# Patient Record
Sex: Female | Born: 1935 | Race: White | Hispanic: No | Marital: Single | Smoking: Never smoker
Health system: Southern US, Community
[De-identification: ages and names within clinical notes are randomized; demographics above are authoritative.]

## PROBLEM LIST (undated history)

## (undated) DIAGNOSIS — H3530 Unspecified macular degeneration: Secondary | ICD-10-CM

## (undated) DIAGNOSIS — F0390 Unspecified dementia without behavioral disturbance: Secondary | ICD-10-CM

## (undated) DIAGNOSIS — I10 Essential (primary) hypertension: Secondary | ICD-10-CM

## (undated) DIAGNOSIS — H409 Unspecified glaucoma: Secondary | ICD-10-CM

## (undated) DIAGNOSIS — E785 Hyperlipidemia, unspecified: Secondary | ICD-10-CM

## (undated) DIAGNOSIS — H547 Unspecified visual loss: Secondary | ICD-10-CM

## (undated) HISTORY — DX: Hyperlipidemia, unspecified: E78.5

## (undated) HISTORY — DX: Essential (primary) hypertension: I10

---

## 2018-01-12 DIAGNOSIS — Y30XXXA Falling, jumping or pushed from a high place, undetermined intent, initial encounter: Secondary | ICD-10-CM | POA: Diagnosis not present

## 2018-01-12 DIAGNOSIS — I10 Essential (primary) hypertension: Secondary | ICD-10-CM | POA: Diagnosis not present

## 2018-01-12 DIAGNOSIS — S7002XA Contusion of left hip, initial encounter: Secondary | ICD-10-CM | POA: Diagnosis not present

## 2018-01-12 DIAGNOSIS — S0081XA Abrasion of other part of head, initial encounter: Secondary | ICD-10-CM | POA: Diagnosis not present

## 2018-01-12 DIAGNOSIS — S40022A Contusion of left upper arm, initial encounter: Secondary | ICD-10-CM | POA: Diagnosis not present

## 2018-01-12 DIAGNOSIS — Z760 Encounter for issue of repeat prescription: Secondary | ICD-10-CM | POA: Diagnosis not present

## 2018-01-12 DIAGNOSIS — S0083XA Contusion of other part of head, initial encounter: Secondary | ICD-10-CM | POA: Diagnosis not present

## 2018-01-20 ENCOUNTER — Ambulatory Visit (INDEPENDENT_AMBULATORY_CARE_PROVIDER_SITE_OTHER): Payer: Medicare HMO | Admitting: Family Medicine

## 2018-01-20 ENCOUNTER — Encounter: Payer: Self-pay | Admitting: Family Medicine

## 2018-01-20 VITALS — BP 148/73 | HR 64 | Temp 97.5°F | Resp 16 | Ht 62.0 in | Wt 127.8 lb

## 2018-01-20 DIAGNOSIS — R413 Other amnesia: Secondary | ICD-10-CM

## 2018-01-20 DIAGNOSIS — Z9181 History of falling: Secondary | ICD-10-CM | POA: Diagnosis not present

## 2018-01-20 DIAGNOSIS — I10 Essential (primary) hypertension: Secondary | ICD-10-CM | POA: Diagnosis not present

## 2018-01-20 MED ORDER — LISINOPRIL 20 MG PO TABS: ORAL_TABLET | ORAL | 1 refills | Status: DC

## 2018-01-20 NOTE — Patient Instructions (Addendum)
No change in blood pressure meds for now. Continue lisinopril once per day.   I will refer you to neurology to discuss memory concerns.   I would also like you to meet with physical therapy. If you are having any new unsteadiness - be seen right away or in emergency room. I would recommend using a cane to help with stability.   Follow up in 1 month to review prior records.   Return to the clinic or go to the nearest emergency room if any of your symptoms worsen or new symptoms occur.   Fall Prevention in the Home Falls can cause injuries and can affect people from all age groups. There are many simple things that you can do to make your home safe and to help prevent falls. What can I do on the outside of my home?  Regularly repair the edges of walkways and driveways and fix any cracks.  Remove high doorway thresholds.  Trim any shrubbery on the main path into your home.  Use bright outdoor lighting.  Clear walkways of debris and clutter, including tools and rocks.  Regularly check that handrails are securely fastened and in good repair. Both sides of any steps should have handrails.  Install guardrails along the edges of any raised decks or porches.  Have leaves, snow, and ice cleared regularly.  Use sand or salt on walkways during winter months.  In the garage, clean up any spills right away, including grease or oil spills. What can I do in the bathroom?  Use night lights.  Install grab bars by the toilet and in the tub and shower. Do not use towel bars as grab bars.  Use non-skid mats or decals on the floor of the tub or shower.  If you need to sit down while you are in the shower, use a plastic, non-slip stool.  Keep the floor dry. Immediately clean up any water that spills on the floor.  Remove soap buildup in the tub or shower on a regular basis.  Attach bath mats securely with double-sided non-slip rug tape.  Remove throw rugs and other tripping hazards from  the floor. What can I do in the bedroom?  Use night lights.  Make sure that a bedside light is easy to reach.  Do not use oversized bedding that drapes onto the floor.  Have a firm chair that has side arms to use for getting dressed.  Remove throw rugs and other tripping hazards from the floor. What can I do in the kitchen?  Clean up any spills right away.  Avoid walking on wet floors.  Place frequently used items in easy-to-reach places.  If you need to reach for something above you, use a sturdy step stool that has a grab bar.  Keep electrical cables out of the way.  Do not use floor polish or wax that makes floors slippery. If you have to use wax, make sure that it is non-skid floor wax.  Remove throw rugs and other tripping hazards from the floor. What can I do in the stairways?  Do not leave any items on the stairs.  Make sure that there are handrails on both sides of the stairs. Fix handrails that are broken or loose. Make sure that handrails are as long as the stairways.  Check any carpeting to make sure that it is firmly attached to the stairs. Fix any carpet that is loose or worn.  Avoid having throw rugs at the top or bottom of  stairways, or secure the rugs with carpet tape to prevent them from moving.  Make sure that you have a light switch at the top of the stairs and the bottom of the stairs. If you do not have them, have them installed. What are some other fall prevention tips?  Wear closed-toe shoes that fit well and support your feet. Wear shoes that have rubber soles or low heels.  When you use a stepladder, make sure that it is completely opened and that the sides are firmly locked. Have someone hold the ladder while you are using it. Do not climb a closed stepladder.  Add color or contrast paint or tape to grab bars and handrails in your home. Place contrasting color strips on the first and last steps.  Use mobility aids as needed, such as canes,  walkers, scooters, and crutches.  Turn on lights if it is dark. Replace any light bulbs that burn out.  Set up furniture so that there are clear paths. Keep the furniture in the same spot.  Fix any uneven floor surfaces.  Choose a carpet design that does not hide the edge of steps of a stairway.  Be aware of any and all pets.  Review your medicines with your healthcare provider. Some medicines can cause dizziness or changes in blood pressure, which increase your risk of falling. Talk with your health care provider about other ways that you can decrease your risk of falls. This may include working with a physical therapist or trainer to improve your strength, balance, and endurance. This information is not intended to replace advice given to you by your health care provider. Make sure you discuss any questions you have with your health care provider. Document Released: 03/18/2002 Document Revised: 08/25/2015 Document Reviewed: 05/02/2014 Elsevier Interactive Patient Education  [employer].   If you have lab work done today you will be contacted with your lab results within the next 2 weeks.  If you have not heard from [place] then please contact us. The fastest way to get your results is to register for My Chart.   IF you received an x-ray today, you will receive an invoice from [facility]. Please contact [facility] at [phone] with questions or concerns regarding your invoice.   IF you received labwork today, you will receive an invoice from [place]. Please contact [facility] at [phone] with questions or concerns regarding your invoice.   Our billing staff will not be able to assist you with questions regarding bills from these companies.  You will be contacted with the lab results as soon as they are available. The fastest way to get your results is to activate your My Chart account. Instructions are located on the last page of this paperwork. If you have  not heard from [place] regarding the results in 2 weeks, please contact this office.

## 2018-01-20 NOTE — Progress Notes (Signed)
Subjective:    Patient ID: Sheri Miller, female    DOB: 1924/02/16, 84 y.o.   MRN: [id number]  HPI Sheri Miller is a 84 y.o. female Presents today for: Chief Complaint  Patient presents with  . Establish Care    patient is establishing care; high fall risk, history of memory loss/confusion and weakness  . blood pressure    currently on blood pressure medication   New patient to me, here to potentially establish care.  She has a history of hypertension, hyperlipidemia per problem list.  Currently on lisinopril 20 mg daily for hypertension.  No previous records noted in [facility] or care everywhere.  Here with son who is providing history. Moved from [place] 3 months ago. [facility], previous provider Sheri Miller. Living with son.  There was some concern with her forgetting things, and concern with living at home. Son feels like memory has been an issue for years, but seems to be worse past few months, but no acute changes in past week or so.   Seen at [facility] urgent Care 11/07/27 and 01/12/18 after falls - initially tripped on root in the bog garden, and then fell on a back deck after going through screen door. No LOC, no hx of CVA.   Records reviewed: 7/29: BP 207/72, facial bone xrays - no acute facial bone fx. R hand XR - degenerative changes, osteopenia, no fracture. Lisinopril 20mg  QD prescribed.   01/12/18: Had run out of lisinopril. Not on meds. BP 151/103 Diagnosed with contusions, abrasions.td negative, L hip XR negative, facial bone XR negative.   Restarted lisinopril 20mg  QD.   Son out of work due to taking her to Urgent care on 10/4, plans on FMLA paperwork for that visit and future visit. Son is POA.   Bruising on face and arm ok. Has had some left shoulder pain from years prior. No recent new pains.    There are no active problems to display for this patient.  Past Medical History:  Diagnosis Date  . Hyperlipidemia   . Hypertension     No Known Allergies Prior to  Admission medications   Medication Sig Start Date End Date Taking? Authorizing Provider  lisinopril (PRINIVIL,ZESTRIL) 20 MG tablet TK 1 T PO QD 01/12/18  Yes [provider]   Social History   Socioeconomic History  . Marital status: Single    Spouse name: Not on file  . Number of children: Not on file  . Years of education: Not on file  . Highest education level: Not on file  Occupational History  . Not on file  Social Needs  . Financial resource strain: Not on file  . Food insecurity:    Worry: Not on file    Inability: Not on file  . Transportation needs:    Medical: Not on file    Non-medical: Not on file  Tobacco Use  . Smoking status: Never Smoker  . Smokeless tobacco: Never Used  Substance and Sexual Activity  . Alcohol use: Not on file  . Drug use: Not on file  . Sexual activity: Not on file  Lifestyle  . Physical activity:    Days per week: Not on file    Minutes per session: Not on file  . Stress: Not on file  Relationships  . Social connections:    Talks on phone: Not on file    Gets together: Not on file    Attends religious service: Not on file  Active member of club or organization: Not on file    Attends meetings of clubs or organizations: Not on file    Relationship status: Not on file  . Intimate partner violence:    Fear of current or ex partner: Not on file    Emotionally abused: Not on file    Physically abused: Not on file    Forced sexual activity: Not on file  Other Topics Concern  . Not on file  Social History Narrative  . Not on file    Review of Systems Per HPI     Objective:   Physical Exam  Constitutional: She appears well-developed and well-nourished.  HENT:  Head: Normocephalic and atraumatic.  Eyes: Pupils are equal, round, and reactive to light. Conjunctivae and EOM are normal.  Neck: Carotid bruit is not present.  Cardiovascular: Normal rate, regular rhythm, normal heart sounds and intact distal pulses.    Pulmonary/Chest: Effort normal and breath sounds normal.  Abdominal: Soft. She exhibits no pulsatile midline mass. There is no tenderness.  Musculoskeletal:  Appears to have full range of motion without apparent difficulty in the upper extremities bilaterally, walking without apparent discomfort.  Neurological: She is alert.  Ambulates without assistance, initially when she stood up and turned the corner seemed to lean slightly but then self corrects, ambulates down the hall with overall normal-appearing gait, fair rate of speed.  Skin: Skin is warm and dry.  Healing ecchymosis noted on left face, left upper arm.  Psychiatric: She has a normal mood and affect. Her behavior is normal.  Vitals reviewed.  Vitals:   01/20/18 1050  BP: (!) 148/73  Pulse: 64  Resp: 16  Temp: (!) 97.5 F (36.4 C)  TempSrc: Oral  SpO2: 96%  Weight: 127 lb 12.8 oz (58 kg)  Height: 5\' 2"  (1.575 m)       Assessment & Plan:   Sheri Miller is a 84 y.o. female  Here to establish care.  Plan discussed with patient and son who is her power of attorney.  Understanding expressed.  History of falling - Plan: Ambulatory referral to Physical Therapy, Ambulatory referral to Neurology  -Both appear to be mechanical falls, denies near syncope/syncope symptoms.  Nonfocal neurologic exam, and gait testing overall reassuring.  Bruising appears to be improving.  Denies any new weakness or range of motion difficulties.  - recommend use of assistive device such as cane if any unsteadiness or in area where there may be trip hazards.    - handout given on fall prevention at home and will refer to physical therapy to assess strength, potentially occupational therapy may be beneficial as well, but will have her evaluated by neurology to determine other needs testing/treatment.  Essential hypertension - Plan: Basic metabolic panel, lisinopril (PRINIVIL,ZESTRIL) 20 MG tablet  -Borderline, but at current age we will continue same  dose of lisinopril for now, recheck next visit  Memory difficulties - Plan: Ambulatory referral to Neurology  -Suspect component of dementia, denies acute changes so delirium less likely.  Now living with son which is a change in her usual routine which may also contribute to some confusion.  Signs or symptoms of delirium were discussed with son and need for acute eval if those were present.  For now will refer to neurology for memory testing/decisions on treatment.   Recheck 1 month to review previous records and further establish care.  RTC precautions if any new or acute worsening symptoms sooner.   Meds ordered this encounter  Medications  . lisinopril (PRINIVIL,ZESTRIL) 20 MG tablet    Sig: TK 1 T PO QD    Dispense:  90 tablet    Refill:  1   Patient Instructions   No change in blood pressure meds for now. Continue lisinopril once per day.   I will refer you to neurology to discuss memory concerns.   I would also like you to meet with physical therapy. If you are having any new unsteadiness - be seen right away or in emergency room. I would recommend using a cane to help with stability.   Follow up in 1 month to review prior records.   Return to the clinic or go to the nearest emergency room if any of your symptoms worsen or new symptoms occur.   Fall Prevention in the Home Falls can cause injuries and can affect people from all age groups. There are many simple things that you can do to make your home safe and to help prevent falls. What can I do on the outside of my home?  Regularly repair the edges of walkways and driveways and fix any cracks.  Remove high doorway thresholds.  Trim any shrubbery on the main path into your home.  Use bright outdoor lighting.  Clear walkways of debris and clutter, including tools and rocks.  Regularly check that handrails are securely fastened and in good repair. Both sides of any steps should have handrails.  Install guardrails along  the edges of any raised decks or porches.  Have leaves, snow, and ice cleared regularly.  Use sand or salt on walkways during winter months.  In the garage, clean up any spills right away, including grease or oil spills. What can I do in the bathroom?  Use night lights.  Install grab bars by the toilet and in the tub and shower. Do not use towel bars as grab bars.  Use non-skid mats or decals on the floor of the tub or shower.  If you need to sit down while you are in the shower, use a plastic, non-slip stool.  Keep the floor dry. Immediately clean up any water that spills on the floor.  Remove soap buildup in the tub or shower on a regular basis.  Attach bath mats securely with double-sided non-slip rug tape.  Remove throw rugs and other tripping hazards from the floor. What can I do in the bedroom?  Use night lights.  Make sure that a bedside light is easy to reach.  Do not use oversized bedding that drapes onto the floor.  Have a firm chair that has side arms to use for getting dressed.  Remove throw rugs and other tripping hazards from the floor. What can I do in the kitchen?  Clean up any spills right away.  Avoid walking on wet floors.  Place frequently used items in easy-to-reach places.  If you need to reach for something above you, use a sturdy step stool that has a grab bar.  Keep electrical cables out of the way.  Do not use floor polish or wax that makes floors slippery. If you have to use wax, make sure that it is non-skid floor wax.  Remove throw rugs and other tripping hazards from the floor. What can I do in the stairways?  Do not leave any items on the stairs.  Make sure that there are handrails on both sides of the stairs. Fix handrails that are broken or loose. Make sure that handrails are as long as  the stairways.  Check any carpeting to make sure that it is firmly attached to the stairs. Fix any carpet that is loose or worn.  Avoid having  throw rugs at the top or bottom of stairways, or secure the rugs with carpet tape to prevent them from moving.  Make sure that you have a light switch at the top of the stairs and the bottom of the stairs. If you do not have them, have them installed. What are some other fall prevention tips?  Wear closed-toe shoes that fit well and support your feet. Wear shoes that have rubber soles or low heels.  When you use a stepladder, make sure that it is completely opened and that the sides are firmly locked. Have someone hold the ladder while you are using it. Do not climb a closed stepladder.  Add color or contrast paint or tape to grab bars and handrails in your home. Place contrasting color strips on the first and last steps.  Use mobility aids as needed, such as canes, walkers, scooters, and crutches.  Turn on lights if it is dark. Replace any light bulbs that burn out.  Set up furniture so that there are clear paths. Keep the furniture in the same spot.  Fix any uneven floor surfaces.  Choose a carpet design that does not hide the edge of steps of a stairway.  Be aware of any and all pets.  Review your medicines with your healthcare provider. Some medicines can cause dizziness or changes in blood pressure, which increase your risk of falling. Talk with your health care provider about other ways that you can decrease your risk of falls. This may include working with a physical therapist or trainer to improve your strength, balance, and endurance. This information is not intended to replace advice given to you by your health care provider. Make sure you discuss any questions you have with your health care provider. Document Released: 03/18/2002 Document Revised: 08/25/2015 Document Reviewed: 05/02/2014 Elsevier Interactive Patient Education  [employer].   If you have lab work done today you will be contacted with your lab results within the next 2 weeks.  If you have not heard from  [place] then please contact us. The fastest way to get your results is to register for My Chart.   IF you received an x-ray today, you will receive an invoice from [facility]. Please contact [facility] at [phone] with questions or concerns regarding your invoice.   IF you received labwork today, you will receive an invoice from [place]. Please contact [facility] at [phone] with questions or concerns regarding your invoice.   Our billing staff will not be able to assist you with questions regarding bills from these companies.  You will be contacted with the lab results as soon as they are available. The fastest way to get your results is to activate your My Chart account. Instructions are located on the last page of this paperwork. If you have not heard from [place] regarding the results in 2 weeks, please contact this office.       Signed,   Merri Ray, MD [facility].  01/22/18 12:38 PM

## 2018-01-21 LAB — BASIC METABOLIC PANEL
BUN/Creatinine Ratio: 15 (ref 12–28)
BUN: 19 mg/dL (ref 10–36)
CO2: 23 mmol/L (ref 20–29)
CREATININE: 1.27 mg/dL — AB (ref 0.57–1.00)
Calcium: 9.9 mg/dL (ref 8.7–10.3)
Chloride: 97 mmol/L (ref 96–106)
GFR, EST AFRICAN AMERICAN: 42 mL/min/{1.73_m2} — AB (ref >59)
GFR, EST NON AFRICAN AMERICAN: 37 mL/min/{1.73_m2} — AB (ref >59)
Glucose: 70 mg/dL (ref 65–99)
Potassium: 4.4 mmol/L (ref 3.5–5.2)
Sodium: 137 mmol/L (ref 134–144)

## 2018-01-23 ENCOUNTER — Encounter: Payer: Self-pay | Admitting: Neurology

## 2018-01-23 ENCOUNTER — Encounter: Payer: Self-pay | Admitting: *Deleted

## 2018-02-01 ENCOUNTER — Telehealth: Payer: Self-pay | Admitting: Family Medicine

## 2018-02-01 NOTE — Telephone Encounter (Signed)
Patient's son needs FMLA forms completed by Dr Carlota Raspberry for his mothers fall. I have completed what I could from the notes and highlighted the areas I was not sure about. I will place the forms in Dr Vonna Kotyk box on 02/01/18 please return to the FMLA/Disability box at the [employer] checkout desk within 5-7 business days. Thank you!

## 2018-02-06 NOTE — Telephone Encounter (Signed)
FMLA paperwork completed.

## 2018-02-06 NOTE — Telephone Encounter (Signed)
Please call pt son at [phone] when the fmla paperwork is available to pick up

## 2018-02-07 NOTE — Telephone Encounter (Signed)
Paperwork scanned and faxed on 02/07/18

## 2018-02-12 NOTE — Telephone Encounter (Signed)
Patient son is calling to request a call back in regards to his FMLA paperwork. He stated the forms are complete incorrectly. He stated he needs a call back on  [phone]. He stated it need to be done as soon as possible.   The fax number is :[phone]

## 2018-02-15 NOTE — Telephone Encounter (Signed)
Message sent to [facility] pool - re: FMLA paperwork due 02/16/2018

## 2018-02-15 NOTE — Telephone Encounter (Signed)
Patient's son, Sanda Klein, calling to check the status of the corrected FMLA paperwork. States that his insurance company told him that Friday (02/16/18) would be the last day they would accept it. Please advise. Would like a call with an update when possible.

## 2018-02-19 ENCOUNTER — Encounter: Payer: Self-pay | Admitting: Family Medicine

## 2018-02-19 ENCOUNTER — Ambulatory Visit (INDEPENDENT_AMBULATORY_CARE_PROVIDER_SITE_OTHER): Payer: Medicare HMO | Admitting: Family Medicine

## 2018-02-19 ENCOUNTER — Other Ambulatory Visit: Payer: Self-pay

## 2018-02-19 VITALS — BP 100/60 | HR 68 | Temp 97.8°F | Ht 61.0 in | Wt 128.4 lb

## 2018-02-19 DIAGNOSIS — I10 Essential (primary) hypertension: Secondary | ICD-10-CM

## 2018-02-19 DIAGNOSIS — R4182 Altered mental status, unspecified: Secondary | ICD-10-CM

## 2018-02-19 DIAGNOSIS — Z1329 Encounter for screening for other suspected endocrine disorder: Secondary | ICD-10-CM

## 2018-02-19 DIAGNOSIS — Z9181 History of falling: Secondary | ICD-10-CM | POA: Diagnosis not present

## 2018-02-19 DIAGNOSIS — Z87898 Personal history of other specified conditions: Secondary | ICD-10-CM

## 2018-02-19 LAB — POC MICROSCOPIC URINALYSIS (UMFC): MUCUS RE: ABSENT

## 2018-02-19 LAB — POCT URINALYSIS DIP (MANUAL ENTRY)
Bilirubin, UA: NEGATIVE
GLUCOSE UA: NEGATIVE mg/dL
Ketones, POC UA: NEGATIVE mg/dL
Nitrite, UA: POSITIVE — AB
Protein Ur, POC: NEGATIVE mg/dL
SPEC GRAV UA: 1.02 (ref 1.010–1.025)
UROBILINOGEN UA: 0.2 U/dL
pH, UA: 6.5 (ref 5.0–8.0)

## 2018-02-19 NOTE — Telephone Encounter (Signed)
Pt's son Sanda Klein put in a request on 02/01/18 for FMLA paperwork to be sent to [place]. Paperwork was incorrect and needed to be resent. He has been trying to have the corrected paperwork sent over since 02/12/18. He was told it was sent in on 02/16/18 but Christella Scheuermann has reported they have not received paperwork. Please advise as soon as possbile. #CB [phone]

## 2018-02-19 NOTE — Patient Instructions (Addendum)
  Appointment with neurologist is Dec 26th, but I will see you until then to continue workup of symptoms.I will check some other testing for memory today, but follow up in 2 weeks.  Decrease lisinopril to 1/2 pill (10mg ) for now.  Use cane for stability.  Try calling Outpatient rehab for appointment as they should have needed info: [phone].   Bring all your meds including any supplements and eye drops to next visit in next 2 weeks.   Return to the clinic or go to the nearest emergency room if any of your symptoms worsen or new symptoms occur.  Thank you for coming in today.   If you have lab work done today you will be contacted with your lab results within the next 2 weeks.  If you have not heard from [place] then please contact us. The fastest way to get your results is to register for My Chart.   IF you received an x-ray today, you will receive an invoice from [facility]. Please contact [facility] at [phone] with questions or concerns regarding your invoice.   IF you received labwork today, you will receive an invoice from [place]. Please contact [facility] at [phone] with questions or concerns regarding your invoice.   Our billing staff will not be able to assist you with questions regarding bills from these companies.  You will be contacted with the lab results as soon as they are available. The fastest way to get your results is to activate your My Chart account. Instructions are located on the last page of this paperwork. If you have not heard from [place] regarding the results in 2 weeks, please contact this office.

## 2018-02-19 NOTE — Telephone Encounter (Signed)
Spoke to patients son and adjusted the forms faxed them on today

## 2018-02-19 NOTE — Progress Notes (Signed)
Subjective:    Patient ID: Sheri Miller, female    DOB: 30-Nov-1923, 84 y.o.   MRN: [id number]  HPI Sheri Miller is a 84 y.o. female Presents today for: Chief Complaint  Patient presents with  . cont est care    forgetfulness   [name] is here with her son for continued follow-up/establishing care.  See last office visit 1 month ago.  She had moved from [place] and there was some concern from family that she was forgetting things and concern for her living at home.  Reportedly her memory had been an issue for years but seem to have worsened the past few months without acute changes otherwise.  She had been seen after 2 recent falls, one in July and then another October 4 when tripping on an object in the garden and then falling after going through storm door. She had been seen by urgent care after those falls, no apparent fractures on imaging.   Mental status:  Suspected component of dementia, and with living with her son, away from [place] may have had some increasing confusion with new routine.  Did not appear to have symptoms of delirium and we last met, but ER precautions were discussed.  I referred her to neurology for memory testing and decisions on treatment.  Fall prevention techniques and handout given for home, as well as referral to physical therapy.  Did recommend use of a cane or other assistive device to help with stability in the meantime. Here with son again. appt with Dr. Delice Lesch 04/05/18 Not using cane.  Son feels like confusion overall same, asking about someone up stairs last week.  Upset that she is not in [place] at times when cold recently, and confused about heating blanket. Seems worse in the morning, not at bedtime.  No fevers, abdominal pain or other new complaints. Does state some R lower abd discomfort prior, but not now.  Takes B complex, fish oil and BP in morning. Does take other OTC meds/supplements and eye gtts.   Hypertension: BP Readings from Last 3  Encounters:  02/19/18 100/60  01/20/18 (!) 148/73   Lab Results  Component Value Date   CREATININE 1.27 (H) 01/20/2018   Lisinopril was refilled at previous dose last visit of 20 mg daily. Has not checked home readings. Not lightheaded or dizzy.   There are no active problems to display for this patient.  Past Medical History:  Diagnosis Date  . Hyperlipidemia   . Hypertension    No past surgical history on file. No Known Allergies Prior to Admission medications   Medication Sig Start Date End Date Taking? Authorizing Provider  lisinopril (PRINIVIL,ZESTRIL) 20 MG tablet TK 1 T PO QD 01/20/18  Yes Wendie Agreste, MD   Social History   Socioeconomic History  . Marital status: Single    Spouse name: Not on file  . Number of children: Not on file  . Years of education: Not on file  . Highest education level: Not on file  Occupational History  . Not on file  Social Needs  . Financial resource strain: Not on file  . Food insecurity:    Worry: Not on file    Inability: Not on file  . Transportation needs:    Medical: Not on file    Non-medical: Not on file  Tobacco Use  . Smoking status: Never Smoker  . Smokeless tobacco: Never Used  Substance and Sexual Activity  . Alcohol use: Not on file  .  Drug use: Not on file  . Sexual activity: Not on file  Lifestyle  . Physical activity:    Days per week: Not on file    Minutes per session: Not on file  . Stress: Not on file  Relationships  . Social connections:    Talks on phone: Not on file    Gets together: Not on file    Attends religious service: Not on file    Active member of club or organization: Not on file    Attends meetings of clubs or organizations: Not on file    Relationship status: Not on file  . Intimate partner violence:    Fear of current or ex partner: Not on file    Emotionally abused: Not on file    Physically abused: Not on file    Forced sexual activity: Not on file  Other Topics Concern    . Not on file  Social History Narrative  . Not on file    Review of Systems Per HPI.     Objective:   Physical Exam  Constitutional: She appears well-developed and well-nourished. No distress.  HENT:  Head: Normocephalic and atraumatic.  Eyes: Pupils are equal, round, and reactive to light. Conjunctivae and EOM are normal.  Neck: Carotid bruit is not present.  Cardiovascular: Normal rate, regular rhythm, normal heart sounds and intact distal pulses.  Pulmonary/Chest: Effort normal and breath sounds normal.  Abdominal: Soft. She exhibits no distension and no pulsatile midline mass. There is no tenderness. There is no guarding and no CVA tenderness.  Neurological: She is alert.  No focal weakness  Skin: Skin is warm and dry.  Psychiatric: She has a normal mood and affect. Her behavior is normal.  Vitals reviewed.  Vitals:   02/19/18 1630 02/19/18 1701  BP: (!) 137/54 100/60  Pulse: 68   Temp: 97.8 F (36.6 C)   TempSrc: Oral   SpO2: 97%   Weight: 128 lb 6.4 oz (58.2 kg)   Height: '5\' 1"'$  (1.549 m)        Assessment & Plan:  Sheri Miller is a 84 y.o. female Altered mental status, unspecified altered mental status type - Plan: POCT urinalysis dipstick, POCT Microscopic Urinalysis ([facility]), Vitamin B12, CBC, TSH  History of abdominal pain - Plan: POCT urinalysis dipstick, POCT Microscopic Urinalysis ([facility]), CBC  History of fall  Essential hypertension  Screening for thyroid disorder - Plan: TSH  Suspected dementia as cause of altered mental status is this is been present for some time when discussed with son.  Does not appear to have delirium given chronicity of symptoms, but did check testing as above to rule out reversible causes.  -B12 level above normal, does take supplementation.  -Elevated TSH, likely component of hypothyroidism.  Will work in for acute visit to discuss results and potential start of medication  -Denies any acute urinary symptoms at this time,  previous abdominal pain, but nontender on exam.  Urinalysis obtained with possible infection, plan on acute visit to repeat urinalysis and urine culture, possible trial for antibiotics at that time.    -Borderline low blood pressure, will decrease lisinopril dose to 10 mg for now.  -Cane for stability and fall precautions discussed.  Referred to PT previously, phone number provided for that center.  -Neuro eval scheduled in December.  -Advised to bring all medications including supplements to next visit to make sure there are no contributing causes.   -ER precautions if acute worsening.   Message left  for son on 11/13 at 73 - will try to reach in am for acute visit or lab visit tomorrow for urine culture. Will start keflex for UTI tomorrow.   Patient seen in consult with dtr in law on 11/14 - plan discussed, urine cx obtained. Started on synthroid and keflex.   No orders of the defined types were placed in this encounter.  Patient Instructions    Appointment with neurologist is Dec 26th, but I will see you until then to continue workup of symptoms.I will check some other testing for memory today, but follow up in 2 weeks.  Decrease lisinopril to 1/2 pill ('10mg'$ ) for now.  Use cane for stability.  Try calling Outpatient rehab for appointment as they should have needed info: [phone].   Bring all your meds including any supplements and eye drops to next visit in next 2 weeks.   Return to the clinic or go to the nearest emergency room if any of your symptoms worsen or new symptoms occur.  Thank you for coming in today.   If you have lab work done today you will be contacted with your lab results within the next 2 weeks.  If you have not heard from [place] then please contact us. The fastest way to get your results is to register for My Chart.   IF you received an x-ray today, you will receive an invoice from [facility]. Please contact [facility] at [phone] with  questions or concerns regarding your invoice.   IF you received labwork today, you will receive an invoice from [facility]. Please contact [facility] at [phone] with questions or concerns regarding your invoice.   Our billing staff will not be able to assist you with questions regarding bills from these companies.  You will be contacted with the lab results as soon as they are available. The fastest way to get your results is to activate your My Chart account. Instructions are located on the last page of this paperwork. If you have not heard from [place] regarding the results in 2 weeks, please contact this office.       Signed,   Merri Ray, MD [facility].  02/21/18 7:06 PM

## 2018-02-20 LAB — CBC
HEMATOCRIT: 39.7 % (ref 34.0–46.6)
Hemoglobin: 13.4 g/dL (ref 11.1–15.9)
MCH: 32.1 pg (ref 26.6–33.0)
MCHC: 33.8 g/dL (ref 31.5–35.7)
MCV: 95 fL (ref 79–97)
Platelets: 339 10*3/uL (ref 150–450)
RBC: 4.18 x10E6/uL (ref 3.77–5.28)
RDW: 13.6 % (ref 12.3–15.4)
WBC: 7.1 10*3/uL (ref 3.4–10.8)

## 2018-02-20 LAB — VITAMIN B12: Vitamin B-12: >2000 pg/mL — ABNORMAL HIGH (ref 232–1245)

## 2018-02-20 LAB — TSH: TSH: 19.95 u[IU]/mL — ABNORMAL HIGH (ref 0.450–4.500)

## 2018-02-22 ENCOUNTER — Ambulatory Visit: Payer: Medicare HMO | Admitting: Family Medicine

## 2018-02-22 ENCOUNTER — Telehealth: Payer: Self-pay | Admitting: Family Medicine

## 2018-02-22 ENCOUNTER — Other Ambulatory Visit: Payer: Self-pay | Admitting: Family Medicine

## 2018-02-22 DIAGNOSIS — N390 Urinary tract infection, site not specified: Secondary | ICD-10-CM | POA: Diagnosis not present

## 2018-02-22 DIAGNOSIS — R319 Hematuria, unspecified: Secondary | ICD-10-CM

## 2018-02-22 DIAGNOSIS — E039 Hypothyroidism, unspecified: Secondary | ICD-10-CM

## 2018-02-22 MED ORDER — LEVOTHYROXINE SODIUM 50 MCG PO TABS: 50.0000 ug | ORAL_TABLET | Freq: Every day | ORAL | 1 refills | Status: DC

## 2018-02-22 MED ORDER — CEPHALEXIN 500 MG PO CAPS: 500.0000 mg | ORAL_CAPSULE | Freq: Two times a day (BID) | ORAL | 0 refills | Status: DC

## 2018-02-22 NOTE — Telephone Encounter (Signed)
Copied from [place] [id number]. Topic: General - Other >> Feb 22, 2018  9:39 AM Cecelia Byars, NT wrote: Reason for CRM: Patients son called and says Dr Nyoka Cowden called yesterday and said he would call him back today . Mr  Sheri Miller is currently at work and is unable to answer the phone and would like for Dr Nyoka Cowden to call his home to speak to his wife  [name] at [phone]  she is currently at home with his mom.

## 2018-02-22 NOTE — Telephone Encounter (Signed)
Patient presents office for discussion of the labs with her daughter-in-law.  Discussed elevated TSH and reason for starting Synthroid, urine culture was obtained.  Start Keflex 500 mg twice daily, plan to follow-up in the next few weeks, but RTC/ER precautions given if any change or worsening symptoms during that time.

## 2018-02-22 NOTE — Progress Notes (Signed)
See lab notes. Will try for lab only visit today for urine culture, then start keflex.   synthroid 63mcg qd with repeat testing in 6 weeks.

## 2018-02-23 ENCOUNTER — Encounter: Payer: Self-pay | Admitting: Family Medicine

## 2018-02-24 LAB — URINE CULTURE

## 2018-02-26 NOTE — Telephone Encounter (Signed)
Patient son called to say that mom was still having symptoms of confusion and issues with her memory. Stated that on 02/25/18 after taking levothyroxine (SYNTHROID, LEVOTHROID) 50 MCG tablet she started shaking while sitting up and almost in tears with some confusion. Son just wanted to inform Dr Nyoka Cowden said that he spoke with the nurse over the weekend but he was just calling back because there have not been too much change. Also states that he is at work but if anyone call back to speak with his wife she is home with patient. Ph# [phone]

## 2018-02-27 ENCOUNTER — Telehealth: Payer: Self-pay

## 2018-02-27 NOTE — Telephone Encounter (Signed)
C/b to pt's son, on ROI.  Would like to confirm if he was able to pick up abx that was prescribed.  Advsied that UTIs can cause confusion.  Very important that pt takes rx as prescribed.  If she has started medication and has not improved or is worsening, please c/b to office or go to urgent care.

## 2018-02-27 NOTE — Telephone Encounter (Signed)
chanda called patient son today see outgoing telephone call to

## 2018-03-13 ENCOUNTER — Other Ambulatory Visit: Payer: Self-pay

## 2018-03-13 ENCOUNTER — Ambulatory Visit (INDEPENDENT_AMBULATORY_CARE_PROVIDER_SITE_OTHER): Payer: Medicare HMO | Admitting: Family Medicine

## 2018-03-13 ENCOUNTER — Encounter: Payer: Self-pay | Admitting: Family Medicine

## 2018-03-13 VITALS — BP 147/77 | HR 65 | Temp 98.2°F | Ht 59.0 in | Wt 130.0 lb

## 2018-03-13 DIAGNOSIS — N390 Urinary tract infection, site not specified: Secondary | ICD-10-CM

## 2018-03-13 DIAGNOSIS — R4789 Other speech disturbances: Secondary | ICD-10-CM

## 2018-03-13 DIAGNOSIS — R000 Tachycardia, unspecified: Secondary | ICD-10-CM

## 2018-03-13 DIAGNOSIS — R4182 Altered mental status, unspecified: Secondary | ICD-10-CM

## 2018-03-13 DIAGNOSIS — R269 Unspecified abnormalities of gait and mobility: Secondary | ICD-10-CM | POA: Diagnosis not present

## 2018-03-13 LAB — POCT CBC
GRANULOCYTE PERCENT: 64.6 % (ref 37–80)
HCT, POC: 39.7 % (ref 29–41)
Hemoglobin: 13.1 g/dL (ref 9.5–13.5)
LYMPH, POC: 2 (ref 0.6–3.4)
MCH, POC: 32.6 pg — AB (ref 27–31.2)
MCHC: 33 g/dL (ref 31.8–35.4)
MCV: 98.8 fL (ref 76–111)
MID (cbc): 0.8 (ref 0–0.9)
MPV: 6.6 fL (ref 0–99.8)
PLATELET COUNT, POC: 378 10*3/uL (ref 142–424)
POC Granulocyte: 5.1 (ref 2–6.9)
POC LYMPH %: 25 % (ref 10–50)
POC MID %: 10.4 %M (ref 0–12)
RBC: 4.02 M/uL — AB (ref 4.04–5.48)
RDW, POC: 14.1 %
WBC: 7.9 10*3/uL (ref 4.6–10.2)

## 2018-03-13 LAB — POCT URINALYSIS DIP (MANUAL ENTRY)
Bilirubin, UA: NEGATIVE
Glucose, UA: NEGATIVE mg/dL
Ketones, POC UA: NEGATIVE mg/dL
NITRITE UA: POSITIVE — AB
PROTEIN UA: NEGATIVE mg/dL
Spec Grav, UA: 1.02 (ref 1.010–1.025)
UROBILINOGEN UA: 0.2 U/dL
pH, UA: 5.5 (ref 5.0–8.0)

## 2018-03-13 LAB — POC MICROSCOPIC URINALYSIS (UMFC): MUCUS RE: ABSENT

## 2018-03-13 MED ORDER — CEPHALEXIN 500 MG PO CAPS: 500.0000 mg | ORAL_CAPSULE | Freq: Two times a day (BID) | ORAL | 0 refills | Status: DC

## 2018-03-13 NOTE — Patient Instructions (Addendum)
  Unfortunately urine test indicates possible persistent urinary infection after antibiotics or return of infection. I will check some other testing for change in mental status and will discuss with neurology to see if other testing needed.  If any worsening symptoms, go to emergency room.    If you have lab work done today you will be contacted with your lab results within the next 2 weeks.  If you have not heard from [place] then please contact us. The fastest way to get your results is to register for My Chart.   IF you received an x-ray today, you will receive an invoice from [facility]. Please contact [facility] at [phone] with questions or concerns regarding your invoice.   IF you received labwork today, you will receive an invoice from [facility]. Please contact [facility] at [phone] with questions or concerns regarding your invoice.   Our billing staff will not be able to assist you with questions regarding bills from these companies.  You will be contacted with the lab results as soon as they are available. The fastest way to get your results is to activate your My Chart account. Instructions are located on the last page of this paperwork. If you have not heard from [place] regarding the results in 2 weeks, please contact this office.

## 2018-03-13 NOTE — Progress Notes (Signed)
Subjective:    Patient ID: Sheri Miller, female    DOB: 25-Apr-1923, 84 y.o.   MRN: [id number]  HPI Sheri Miller is a 84 y.o. female Presents today for: Chief Complaint  Patient presents with  . Altered Mental Status    with medication review  . thyroid check   Follow-up for altered mental status.  See previous visits.  Has been referred to neurology for suspected dementia, but possible treatable causes noted with hypothyroidism and urinary tract infection.  She was treated for urinary tract infection with Keflex 500 mg twice daily for 10 days starting November 14.  Culture positive for E. coli, appeared to be sensitive to cephalosporins. Initial day of antibiotics was more fatigued, and slurring words. Did not seek care for those symptoms.  Second day was shaking/shivering.  Called nurse. Shaking got better next day.  Got back to normal 2 days later.   TSH of 19.95 on November 11.  Treated with Synthroid 50 mcg once per day for new diagnosis of hypothyroidism with repeat TSH planned at 6 weeks.   Son feels like memory has gotten worse over past 2 weeks, not better. Didn't  know who he was to her last night. He has noticed her walking differently past few days. Leaning or favoring left hip. More wobbly than normal past few days, more confused past few days.  Has multiple supplements from home including marine phytoplankton, B12 1000 mcg, DNA X-Cell rejuvenate,Salinex GSH,  B6 50 mg, alendronate 70 mg, lisinopril 20 mg (taking 1/2 per day),  arthritis pain relief acetaminophen 650 mg, Advil 200 mg,  naproxen 220mg . Sometime brings a hand full of some of these supplements out form these supply.  Son has been dispensing levothyroxine, lisinopril, and fish oil.  6CIT Screen 03/13/2018  What Year? 4 points  What month? 3 points  What time? 3 points  Count back from 20 0 points  Months in reverse 4 points  Repeat phrase 2 points  Total Score 16    There are no active problems to display for  this patient.  Past Medical History:  Diagnosis Date  . Hyperlipidemia   . Hypertension    No past surgical history on file. No Known Allergies Prior to Admission medications   Medication Sig Start Date End Date Taking? Authorizing Provider  levothyroxine (SYNTHROID, LEVOTHROID) 50 MCG tablet Take 1 tablet (50 mcg total) by mouth daily. 02/22/18  Yes Wendie Agreste, MD  lisinopril (PRINIVIL,ZESTRIL) 20 MG tablet TK 1 T PO QD 01/20/18  Yes Wendie Agreste, MD   Social History   Socioeconomic History  . Marital status: Single    Spouse name: Not on file  . Number of children: Not on file  . Years of education: Not on file  . Highest education level: Not on file  Occupational History  . Not on file  Social Needs  . Financial resource strain: Not on file  . Food insecurity:    Worry: Not on file    Inability: Not on file  . Transportation needs:    Medical: Not on file    Non-medical: Not on file  Tobacco Use  . Smoking status: Never Smoker  . Smokeless tobacco: Never Used  Substance and Sexual Activity  . Alcohol use: Not on file  . Drug use: Not on file  . Sexual activity: Not on file  Lifestyle  . Physical activity:    Days per week: Not on file    Minutes per  session: Not on file  . Stress: Not on file  Relationships  . Social connections:    Talks on phone: Not on file    Gets together: Not on file    Attends religious service: Not on file    Active member of club or organization: Not on file    Attends meetings of clubs or organizations: Not on file    Relationship status: Not on file  . Intimate partner violence:    Fear of current or ex partner: Not on file    Emotionally abused: Not on file    Physically abused: Not on file    Forced sexual activity: Not on file  Other Topics Concern  . Not on file  Social History Narrative  . Not on file    Review of Systems     Objective:   Physical Exam  Constitutional: She is oriented to person, place,  and time. She appears well-developed and well-nourished. No distress.  Pleasant, interactive.   HENT:  Head: Normocephalic and atraumatic.  Eyes: Pupils are equal, round, and reactive to light. Conjunctivae and EOM are normal.  Neck: Carotid bruit is not present.  Cardiovascular: Regular rhythm, normal heart sounds and intact distal pulses. Tachycardia present.  Pulmonary/Chest: Effort normal and breath sounds normal.  Abdominal: Soft. She exhibits no pulsatile midline mass. There is no tenderness.  Neurological: She is alert and oriented to person, place, and time.  No pronator drift.  No facial droop, equal facial movements, no air leakage with puffing cheeks.  Equal grip strength and gross lower extremity strength seated.  Slight antalgic gait, appears to drop left hip slightly.   Skin: Skin is warm and dry.  Psychiatric: She has a normal mood and affect. Her speech is normal and behavior is normal. Cognition and memory are impaired.  Vitals reviewed.  Vitals:   03/13/18 1651 03/13/18 1757  BP: (!) 142/75 (!) 147/77  Pulse: (!) 112 65  Temp: 98.2 F (36.8 C)   TempSrc: Oral   SpO2: 98%   Weight: 130 lb (59 kg)   Height: 4\' 11"  (1.499 m)    Results for orders placed or performed in visit on 03/13/18  POCT urinalysis dipstick  Result Value Ref Range   Color, UA yellow yellow   Clarity, UA cloudy (A) clear   Glucose, UA negative negative mg/dL   Bilirubin, UA negative negative   Ketones, POC UA negative negative mg/dL   Spec Grav, UA 1.020 1.010 - 1.025   Blood, UA small (A) negative   pH, UA 5.5 5.0 - 8.0   Protein Ur, POC negative negative mg/dL   Urobilinogen, UA 0.2 0.2 or 1.0 E.U./dL   Nitrite, UA Positive (A) Negative   Leukocytes, UA Small (1+) (A) Negative       Assessment & Plan:   Sheri Miller is a 84 y.o. female Urinary tract infection without hematuria, site unspecified - Plan: POCT urinalysis dipstick, POCT Microscopic Urinalysis ([facility]), Urine Culture, POCT  CBC, cephALEXin (KEFLEX) 500 MG capsule  Altered mental status, unspecified altered mental status type - Plan: Comprehensive metabolic panel, T4, Free, T3, Free, POCT CBC, CANCELED: CBC  Tachycardia - Plan: POCT CBC, CANCELED: CBC  Other speech disturbance  Gait abnormality  Suspected dementia with variations in mental status, but was treated most recently for possible secondary delirium on top of dementia with UTI and hypothyroidism.  Reported symptoms day after starting treatment could have been due to a urinary tract infection but those have  now cleared.  Does report some slurring of speech and more fatigue the first day but those symptoms have also cleared.  As far as gait abnormality, she does have a slight antalgic gait but may be due to favoring her hip that was reportedly sore previously.  No other focal weakness appreciated on exam.  -With some variability in symptoms, subjective worsening, I did restart Keflex and check urine culture for possible persistent UTI, but differential includes asymptomatic bacteriuria.  -Check electrolytes  -Check free T3, T4, but unlikely overdosage of Synthroid.  -Multiple herbal medications reviewed, reportedly has been decreasing use of those meds.  Son has been monitoring her medication for antibiotics, thyroid and blood pressure.  -Has follow-up planned with neurology, consider call prior to that visit if not improving with current treatment.   -ER precautions given if any acute worsening, discussed with son, understanding expressed.  Meds ordered this encounter  Medications  . cephALEXin (KEFLEX) 500 MG capsule    Sig: Take 1 capsule (500 mg total) by mouth 2 (two) times daily.    Dispense:  20 capsule    Refill:  0   Patient Instructions    Unfortunately urine test indicates possible persistent urinary infection after antibiotics or return of infection. I will check some other testing for change in mental status and will discuss with neurology  to see if other testing needed.  If any worsening symptoms, go to emergency room.    If you have lab work done today you will be contacted with your lab results within the next 2 weeks.  If you have not heard from [place] then please contact us. The fastest way to get your results is to register for My Chart.   IF you received an x-ray today, you will receive an invoice from [facility]. Please contact [facility] at [phone] with questions or concerns regarding your invoice.   IF you received labwork today, you will receive an invoice from [facility]. Please contact [facility] at [phone] with questions or concerns regarding your invoice.   Our billing staff will not be able to assist you with questions regarding bills from these companies.  You will be contacted with the lab results as soon as they are available. The fastest way to get your results is to activate your My Chart account. Instructions are located on the last page of this paperwork. If you have not heard from [place] regarding the results in 2 weeks, please contact this office.      Signed,   Merri Ray, MD [facility].  03/14/18 10:07 AM

## 2018-03-14 LAB — COMPREHENSIVE METABOLIC PANEL
ALBUMIN: 4.6 g/dL (ref 3.2–4.6)
ALK PHOS: 53 IU/L (ref 39–117)
ALT: 13 IU/L (ref 0–32)
AST: 20 IU/L (ref 0–40)
Albumin/Globulin Ratio: 1.4 (ref 1.2–2.2)
BUN / CREAT RATIO: 25 (ref 12–28)
BUN: 30 mg/dL (ref 10–36)
Bilirubin Total: 0.3 mg/dL (ref 0.0–1.2)
CALCIUM: 9.1 mg/dL (ref 8.7–10.3)
CO2: 20 mmol/L (ref 20–29)
CREATININE: 1.18 mg/dL — AB (ref 0.57–1.00)
Chloride: 106 mmol/L (ref 96–106)
GFR calc non Af Amer: 40 mL/min/{1.73_m2} — ABNORMAL LOW (ref >59)
GFR, EST AFRICAN AMERICAN: 46 mL/min/{1.73_m2} — AB (ref >59)
Globulin, Total: 3.3 g/dL (ref 1.5–4.5)
Glucose: 86 mg/dL (ref 65–99)
Potassium: 4.8 mmol/L (ref 3.5–5.2)
Sodium: 143 mmol/L (ref 134–144)
TOTAL PROTEIN: 7.9 g/dL (ref 6.0–8.5)

## 2018-03-14 LAB — T3, FREE: T3, Free: 1.9 pg/mL — ABNORMAL LOW (ref 2.0–4.4)

## 2018-03-14 LAB — T4, FREE: FREE T4: 0.83 ng/dL (ref 0.82–1.77)

## 2018-03-15 LAB — URINE CULTURE

## 2018-04-02 DIAGNOSIS — R69 Illness, unspecified: Secondary | ICD-10-CM | POA: Diagnosis not present

## 2018-04-05 ENCOUNTER — Other Ambulatory Visit: Payer: Self-pay

## 2018-04-05 ENCOUNTER — Ambulatory Visit: Payer: Medicare HMO | Admitting: Neurology

## 2018-04-05 ENCOUNTER — Encounter: Payer: Self-pay | Admitting: Neurology

## 2018-04-05 ENCOUNTER — Encounter

## 2018-04-05 VITALS — BP 120/76 | HR 59 | Ht 59.0 in | Wt 130.0 lb

## 2018-04-05 DIAGNOSIS — R69 Illness, unspecified: Secondary | ICD-10-CM | POA: Diagnosis not present

## 2018-04-05 DIAGNOSIS — F0390 Unspecified dementia without behavioral disturbance: Secondary | ICD-10-CM

## 2018-04-05 DIAGNOSIS — F03A0 Unspecified dementia, mild, without behavioral disturbance, psychotic disturbance, mood disturbance, and anxiety: Secondary | ICD-10-CM

## 2018-04-05 MED ORDER — ESCITALOPRAM OXALATE 10 MG PO TABS: 10.0000 mg | ORAL_TABLET | Freq: Every day | ORAL | 11 refills | Status: DC

## 2018-04-05 NOTE — Patient Instructions (Addendum)
1. Start Lexapro 10mg  every bedtime. We can increase dose in a month if necessary. 2. Recommend looking into day programs 3. Follow-up in 6 months, call for any changes  We have sent a referral to [facility] for your CT and they will call you directly to schedule your appt. They are located at [address]. If you need to contact them directly please call [phone].     FALL PRECAUTIONS: Be cautious when walking. Scan the area for obstacles that may increase the risk of trips and falls. When getting up in the mornings, sit up at the edge of the bed for a few minutes before getting out of bed. Consider elevating the bed at the head end to avoid drop of blood pressure when getting up. Walk always in a well-lit room (use night lights in the walls). Avoid area rugs or power cords from appliances in the middle of the walkways. Use a walker or a cane if necessary and consider physical therapy for balance exercise. Get your eyesight checked regularly.  HOME SAFETY: Consider the safety of the kitchen when operating appliances like stoves, microwave oven, and blender. Consider having supervision and share cooking responsibilities until no longer able to participate in those. Accidents with firearms and other hazards in the house should be identified and addressed as well.  ABILITY TO BE LEFT ALONE: If patient is unable to contact 911 operator, consider using LifeLine, or when the need is there, arrange for someone to stay with patients. Smoking is a fire hazard, consider supervision or cessation. Risk of wandering should be assessed by caregiver and if detected at any point, supervision and safe proof recommendations should be instituted.  MEDICATION SUPERVISION: Inability to self-administer medication needs to be constantly addressed. Implement a mechanism to ensure safe administration of the medications.  RECOMMENDATIONS FOR ALL PATIENTS WITH MEMORY PROBLEMS: 1. Continue to exercise  (Recommend 30 minutes of walking everyday, or 3 hours every week) 2. Increase social interactions - continue going to [place] and enjoy social gatherings with friends and family 3. Eat healthy, avoid fried foods and eat more fruits and vegetables 4. Maintain adequate blood pressure, blood sugar, and blood cholesterol level. Reducing the risk of stroke and cardiovascular disease also helps promoting better memory. 5. Avoid stressful situations. Live a simple life and avoid aggravations. Organize your time and prepare for the next day in anticipation. 6. Sleep well, avoid any interruptions of sleep and avoid any distractions in the bedroom that may interfere with adequate sleep quality 7. Avoid sugar, avoid sweets as there is a strong link between excessive sugar intake, diabetes, and cognitive impairment The Mediterranean diet has been shown to help patients reduce the risk of progressive memory disorders and reduces cardiovascular risk. This includes eating fish, eat fruits and green leafy vegetables, nuts like almonds and hazelnuts, walnuts, and also use olive oil. Avoid fast foods and fried foods as much as possible. Avoid sweets and sugar as sugar use has been linked to worsening of memory function.  There is always a concern of gradual progression of memory problems. If this is the case, then we may need to adjust level of care according to patient needs. Support, both to the patient and caregiver, should then be put into place.
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
## 2018-04-09 ENCOUNTER — Telehealth: Payer: Self-pay | Admitting: Family Medicine

## 2018-04-09 ENCOUNTER — Ambulatory Visit
Admission: RE | Admit: 2018-04-09 | Discharge: 2018-04-09 | Disposition: A | Discharge status: Home / Self Care | Payer: Medicare HMO | Source: Ambulatory Visit | Attending: Neurology | Admitting: Neurology

## 2018-04-09 DIAGNOSIS — F03A0 Unspecified dementia, mild, without behavioral disturbance, psychotic disturbance, mood disturbance, and anxiety: Secondary | ICD-10-CM

## 2018-04-09 DIAGNOSIS — R269 Unspecified abnormalities of gait and mobility: Secondary | ICD-10-CM | POA: Diagnosis not present

## 2018-04-09 DIAGNOSIS — F0390 Unspecified dementia without behavioral disturbance: Secondary | ICD-10-CM

## 2018-04-09 NOTE — Telephone Encounter (Signed)
Please advise 

## 2018-04-09 NOTE — Telephone Encounter (Signed)
Copied from [place] [id number]. Topic: Appointment Scheduling - Scheduling Inquiry for Clinic >> Apr 09, 2018 10:24 AM Scherrie Gerlach wrote: Reason for CRM: son calling to ask Dr Carlota Raspberry when pt shuld return for follow up?  Pt saw neurologist whom he referred he to, but did not advise when to return to see Dr Carlota Raspberry. Please call back

## 2018-04-09 NOTE — Telephone Encounter (Signed)
Reviewed chart and contacted pt's son, who is POA.  Son wants to know if Dr. Carlota Raspberry wants f/u scheduled to review neurology note.  Advised son that provider can review neuro note in system and that neuro will be following pt for memory changes and the prescribed medications.  No appt needed for neuro f/u at this time but son can schedule appt if they have add'l/other concerns.

## 2018-04-13 DIAGNOSIS — N390 Urinary tract infection, site not specified: Secondary | ICD-10-CM | POA: Diagnosis not present

## 2018-04-17 ENCOUNTER — Telehealth: Payer: Self-pay

## 2018-04-17 NOTE — Telephone Encounter (Signed)
-----   Message from [name], MD sent at 04/12/2018 11:45 AM EST ----- Pls let son know head CT did not show any evidence of tumor, stroke, or bleed. It showed age-related changes and hardening of the small blood vessels in the brain seen in patients with blood pressure issues. Continue control of BP, cholesterol. Thanks

## 2018-04-17 NOTE — Telephone Encounter (Signed)
LMOM for pt's son advising that CT results are ready.  Asked for return call to relay message below.

## 2018-04-18 ENCOUNTER — Ambulatory Visit (INDEPENDENT_AMBULATORY_CARE_PROVIDER_SITE_OTHER): Payer: Medicare HMO | Admitting: Family Medicine

## 2018-04-18 ENCOUNTER — Ambulatory Visit (INDEPENDENT_AMBULATORY_CARE_PROVIDER_SITE_OTHER): Payer: Medicare HMO

## 2018-04-18 ENCOUNTER — Encounter: Payer: Self-pay | Admitting: Family Medicine

## 2018-04-18 ENCOUNTER — Telehealth: Payer: Self-pay | Admitting: Family Medicine

## 2018-04-18 ENCOUNTER — Other Ambulatory Visit: Payer: Self-pay

## 2018-04-18 VITALS — BP 164/70 | HR 55 | Temp 97.7°F | Ht 60.0 in | Wt 131.6 lb

## 2018-04-18 DIAGNOSIS — R410 Disorientation, unspecified: Secondary | ICD-10-CM

## 2018-04-18 DIAGNOSIS — M25551 Pain in right hip: Secondary | ICD-10-CM

## 2018-04-18 DIAGNOSIS — I10 Essential (primary) hypertension: Secondary | ICD-10-CM | POA: Diagnosis not present

## 2018-04-18 DIAGNOSIS — Z0271 Encounter for disability determination: Secondary | ICD-10-CM

## 2018-04-18 DIAGNOSIS — E039 Hypothyroidism, unspecified: Secondary | ICD-10-CM

## 2018-04-18 LAB — POCT URINALYSIS DIP (MANUAL ENTRY)
Bilirubin, UA: NEGATIVE
Blood, UA: NEGATIVE
Glucose, UA: NEGATIVE mg/dL
Ketones, POC UA: NEGATIVE mg/dL
Leukocytes, UA: NEGATIVE
NITRITE UA: NEGATIVE
Protein Ur, POC: NEGATIVE mg/dL
Spec Grav, UA: 1.02 (ref 1.010–1.025)
Urobilinogen, UA: 0.2 E.U./dL
pH, UA: 5.5 (ref 5.0–8.0)

## 2018-04-18 LAB — POC MICROSCOPIC URINALYSIS (UMFC): Mucus: ABSENT

## 2018-04-18 MED ORDER — LISINOPRIL 20 MG PO TABS: ORAL_TABLET | ORAL | 1 refills | Status: DC

## 2018-04-18 MED ORDER — LEVOTHYROXINE SODIUM 50 MCG PO TABS: 50.0000 ug | ORAL_TABLET | Freq: Every day | ORAL | 1 refills | Status: DC

## 2018-04-18 NOTE — Patient Instructions (Addendum)
  Restart lisinopril at full pill or 20 mg each day to see if that improves blood pressure.  Check blood pressures at home and if they remain over 140/90 at this new dose please let me know.,   I will recheck the thyroid test today but continue same dose of thyroid medicine for now until we see those results.   Can try Tylenol over-the-counter a few times per day if needed for right hip pain, but if there are concerns on x-ray I will let you know.  If the Tylenol does not help with the pain or still having some difficulty walking, please schedule appointment for further evaluation. Return to the clinic or go to the nearest emergency room if any of your symptoms worsen or new symptoms occur.   Follow-up in the next 6 weeks for recheck blood pressure, sooner if any new symptoms.  Thank you for coming in today.    If you have lab work done today you will be contacted with your lab results within the next 2 weeks.  If you have not heard from [place] then please contact us. The fastest way to get your results is to register for My Chart.   IF you received an x-ray today, you will receive an invoice from [facility]. Please contact [facility] at [phone] with questions or concerns regarding your invoice.   IF you received labwork today, you will receive an invoice from [facility]. Please contact [facility] at [phone] with questions or concerns regarding your invoice.   Our billing staff will not be able to assist you with questions regarding bills from these companies.  You will be contacted with the lab results as soon as they are available. The fastest way to get your results is to activate your My Chart account. Instructions are located on the last page of this paperwork. If you have not heard from [place] regarding the results in 2 weeks, please contact this office.

## 2018-04-18 NOTE — Progress Notes (Signed)
Subjective:    Patient ID: Sheri Miller, female    DOB: [birth year], 85 y.o.   MRN: [id number]  HPI Sheri Miller is a 85 y.o. female Presents today for: Chief Complaint  Patient presents with  . Hospitalization Follow-up  . Urinary Tract Infection    dx saturday  . Medication Refill    synthroid and lisinopril   Hypertension: BP Readings from Last 3 Encounters:  04/18/18 (!) 164/70  04/05/18 120/76  03/13/18 (!) 147/77   Lab Results  Component Value Date   CREATININE 1.18 (H) 03/13/2018  Has taken lisinopril 20 mg daily in past, decreased to 10mg  in November due to slight low readings then.   Hypothyroidism: Lab Results  Component Value Date   TSH 19.950 (H) 02/19/2018  New diagnosis of hypothyroidism in November, started on Synthroid 50 mcg daily. Has been taking daily.   Altered mental status: Restarted Keflex at December visit for possible urinary tract infection component.  Urine culture was positive for E. Coli.  She has since been evaluated by Dr. Delice Lesch with neurology on December 26th.  MMSE 24 out of 30.  Thought to have component of Alzheimer's disease.  Discussed mood/behavioral changes with dementia, and decided on Lexapro 10 mg nightly, decided against use of cholinesterase inhibitor at this time.  Recommended to look into day programs and follow-up in 6 months.  Here with son. Sleeping better past few nights, but worse confusion few days ago. Seen at urgent care. Diagnosed again with UTI - prescribed antibiotics. Unknown name. Mental status has been improving. No abd pain, no fever, no new urinary symptoms.   R hip pain: On and off for months. Did have some falls in past , but pain returns past month. Able to walk, but some favoring of R hip.   There are no active problems to display for this patient.  Past Medical History:  Diagnosis Date  . Hyperlipidemia   . Hypertension    No past surgical history on file. No Known Allergies Prior to Admission  medications   Medication Sig Start Date End Date Taking? Authorizing Provider  cephALEXin (KEFLEX) 500 MG capsule Take 1 capsule (500 mg total) by mouth 2 (two) times daily. 03/13/18  Yes Wendie Agreste, MD  escitalopram (LEXAPRO) 10 MG tablet Take 1 tablet (10 mg total) by mouth at bedtime. 04/05/18  Yes [name], MD  levothyroxine (SYNTHROID, LEVOTHROID) 50 MCG tablet Take 1 tablet (50 mcg total) by mouth daily. 02/22/18  Yes Wendie Agreste, MD  lisinopril (PRINIVIL,ZESTRIL) 20 MG tablet TK 1 T PO QD 01/20/18  Yes Wendie Agreste, MD   Social History   Socioeconomic History  . Marital status: Single    Spouse name: Not on file  . Number of children: Not on file  . Years of education: Not on file  . Highest education level: Not on file  Occupational History  . Not on file  Social Needs  . Financial resource strain: Not on file  . Food insecurity:    Worry: Not on file    Inability: Not on file  . Transportation needs:    Medical: Not on file    Non-medical: Not on file  Tobacco Use  . Smoking status: Never Smoker  . Smokeless tobacco: Never Used  Substance and Sexual Activity  . Alcohol use: Not on file  . Drug use: Not on file  . Sexual activity: Not on file  Lifestyle  . Physical activity:  Days per week: Not on file    Minutes per session: Not on file  . Stress: Not on file  Relationships  . Social connections:    Talks on phone: Not on file    Gets together: Not on file    Attends religious service: Not on file    Active member of club or organization: Not on file    Attends meetings of clubs or organizations: Not on file    Relationship status: Not on file  . Intimate partner violence:    Fear of current or ex partner: Not on file    Emotionally abused: Not on file    Physically abused: Not on file    Forced sexual activity: Not on file  Other Topics Concern  . Not on file  Social History Narrative   Pt lives with her son and his family (wife  and 2 sons) in 2 story home   Has 4 children   8th grade education   Retired Regulatory affairs officer     Review of Systems Per HPI.     Objective:   Physical Exam Vitals signs reviewed.  Constitutional:      Appearance: She is well-developed.  HENT:     Head: Normocephalic and atraumatic.  Eyes:     Conjunctiva/sclera: Conjunctivae normal.     Pupils: Pupils are equal, round, and reactive to light.  Neck:     Vascular: No carotid bruit.  Cardiovascular:     Rate and Rhythm: Normal rate and regular rhythm.     Heart sounds: Normal heart sounds.  Pulmonary:     Effort: Pulmonary effort is normal.     Breath sounds: Normal breath sounds.  Abdominal:     Palpations: Abdomen is soft. There is no pulsatile mass.     Tenderness: There is no abdominal tenderness.  Musculoskeletal:     Right hip: She exhibits decreased range of motion (slight dec int and ext rotation, but pain free exam. favors R leg with ambulation but able to ambulate  without assistive device. ). She exhibits no tenderness.  Skin:    General: Skin is warm and dry.  Neurological:     Mental Status: She is alert and oriented to person, place, and time.  Psychiatric:        Behavior: Behavior normal.    Vitals:   04/18/18 1134 04/18/18 1139  BP: (!) 172/68 (!) 164/70  Pulse: (!) 55   Temp: 97.7 F (36.5 C)   TempSrc: Oral   SpO2: 97%   Weight: 131 lb 9.6 oz (59.7 kg)   Height: 5' (1.524 m)       Results for orders placed or performed in visit on 04/18/18  POCT urinalysis dipstick  Result Value Ref Range   Color, UA yellow yellow   Clarity, UA clear clear   Glucose, UA negative negative mg/dL   Bilirubin, UA negative negative   Ketones, POC UA negative negative mg/dL   Spec Grav, UA 1.020 1.010 - 1.025   Blood, UA negative negative   pH, UA 5.5 5.0 - 8.0   Protein Ur, POC negative negative mg/dL   Urobilinogen, UA 0.2 0.2 or 1.0 E.U./dL   Nitrite, UA Negative Negative   Leukocytes, UA Negative Negative    Dg Hip Unilat W Or W/o Pelvis 2-3 Views Right  Result Date: 04/18/2018 CLINICAL DATA:  Right hip pain EXAM: DG HIP (WITH OR WITHOUT PELVIS) 3V RIGHT COMPARISON:  None. FINDINGS: No acute fracture or dislocation is  noted. Pelvic ring is intact. Degenerative changes of lumbar spine are noted. Aortic calcifications are seen. No soft tissue abnormality is noted. IMPRESSION: No acute abnormality in the right hip. Electronically Signed   By: Inez Catalina M.D.   On: 04/18/2018 12:30      Assessment & Plan:   Sheri Miller is a 85 y.o. female Hypothyroidism, unspecified type - Plan: levothyroxine (SYNTHROID, LEVOTHROID) 50 MCG tablet, TSH + free T4  -Check labs, continue Synthroid same dose for now.  Disorientated - Plan: POCT urinalysis dipstick, POCT Microscopic Urinalysis ([facility])  -Improved urinalysis after recent treatment.  Alzheimer's dementia noted from neurology evaluation.  FMLA paperwork was completed for son for providing transportation.  Follow-up with Dr. Delice Lesch as planned.  now on Lexapro for mood symptoms with dementia and appears to be tolerating.  Essential hypertension - Plan: lisinopril (PRINIVIL,ZESTRIL) 20 MG tablet  -Decreased control, restart lisinopril at 20 mg daily, monitor home readings and RTC precautions  Right hip pain - Plan: DG HIP UNILAT W OR W/O PELVIS 2-3 VIEWS RIGHT  -No concerning findings on x-ray.  Trial of over-the-counter Tylenol initially, then if persistent favoring of that hip or not improving advised to return to discuss further.  RTC precautions if acute worse.  Meds ordered this encounter  Medications  . levothyroxine (SYNTHROID, LEVOTHROID) 50 MCG tablet    Sig: Take 1 tablet (50 mcg total) by mouth daily.    Dispense:  30 tablet    Refill:  1  . lisinopril (PRINIVIL,ZESTRIL) 20 MG tablet    Sig: TK 1 T PO QD    Dispense:  90 tablet    Refill:  1   Patient Instructions    Restart lisinopril at full pill or 20 mg each day to see if that  improves blood pressure.  Check blood pressures at home and if they remain over 140/90 at this new dose please let me know.,   I will recheck the thyroid test today but continue same dose of thyroid medicine for now until we see those results.   Can try Tylenol over-the-counter a few times per day if needed for right hip pain, but if there are concerns on x-ray I will let you know.  If the Tylenol does not help with the pain or still having some difficulty walking, please schedule appointment for further evaluation. Return to the clinic or go to the nearest emergency room if any of your symptoms worsen or new symptoms occur.   Follow-up in the next 6 weeks for recheck blood pressure, sooner if any new symptoms.  Thank you for coming in today.    If you have lab work done today you will be contacted with your lab results within the next 2 weeks.  If you have not heard from [place] then please contact us. The fastest way to get your results is to register for My Chart.   IF you received an x-ray today, you will receive an invoice from [facility]. Please contact [facility] at [phone] with questions or concerns regarding your invoice.   IF you received labwork today, you will receive an invoice from [facility]. Please contact [facility] at [phone] with questions or concerns regarding your invoice.   Our billing staff will not be able to assist you with questions regarding bills from these companies.  You will be contacted with the lab results as soon as they are available. The fastest way to get your results is to activate your My Chart account.  Instructions are located on the last page of this paperwork. If you have not heard from [place] regarding the results in 2 weeks, please contact this office.       Signed,   Merri Ray, MD [facility].  04/21/18 2:54 PM

## 2018-04-18 NOTE — Telephone Encounter (Signed)
Patient's son needs FMLA forms completed with updated information so he can take care of his mother and take her to and from her all her appointments. I was not sure how to complete the forms since the note was not complete on the most recent visit. I will place the forms in Dr. Vonna Kotyk box on 04/18/18 please complete and return to the FMLA/Disability box at the checkout desk within 5-7 business days.  Thank you!

## 2018-04-19 LAB — TSH+FREE T4
Free T4: 1.24 ng/dL (ref 0.82–1.77)
TSH: 5.93 u[IU]/mL — ABNORMAL HIGH (ref 0.450–4.500)

## 2018-04-20 NOTE — Telephone Encounter (Signed)
Pt's daughter in law, [name], return my call.  Relayed results below.

## 2018-04-20 NOTE — Telephone Encounter (Signed)
Paper work completed.

## 2018-04-23 NOTE — Telephone Encounter (Signed)
Paperwork scanned and faxed on 04/23/18

## 2018-04-25 DIAGNOSIS — H6123 Impacted cerumen, bilateral: Secondary | ICD-10-CM | POA: Diagnosis not present

## 2018-04-26 ENCOUNTER — Telehealth: Payer: Self-pay | Admitting: Neurology

## 2018-04-26 NOTE — Telephone Encounter (Signed)
Asking about paperwork that was dropped off in regards to her going into a daycare center. Wanting to know when these will be ready.  Please call her back at [phone]. Thanks!

## 2018-04-26 NOTE — Telephone Encounter (Signed)
Returned call.  No answer.  LMOM for [name] advising that paperwork had been completed and faxed to [place] this AM ATTN: Arnell Asal.

## 2018-05-01 NOTE — Telephone Encounter (Signed)
Patient's son called to request that his FMLA paperwork be corrected according to his insurance.  He stated that the start date should be before the 8th in order for him to get coverage.  Please advise and call back at [phone]

## 2018-05-03 NOTE — Telephone Encounter (Signed)
Paperwork has been updated and resent

## 2018-05-06 DIAGNOSIS — R300 Dysuria: Secondary | ICD-10-CM | POA: Diagnosis not present

## 2018-05-31 ENCOUNTER — Ambulatory Visit: Payer: Medicare HMO | Admitting: Family Medicine

## 2018-06-04 ENCOUNTER — Telehealth: Payer: Self-pay | Admitting: Family Medicine

## 2018-06-04 NOTE — Telephone Encounter (Signed)
Pts son dropped off handicap form to be completed by Dr Vicki Mallet phone [phone]

## 2018-06-05 NOTE — Telephone Encounter (Signed)
Handicap forms are in ur box to be filled out

## 2018-06-06 NOTE — Telephone Encounter (Signed)
Completed, but if hip pain not improving (see last ov) recommend follow up appt.

## 2018-06-08 NOTE — Telephone Encounter (Signed)
I have called pt's daughter and she is coming in to pick up.

## 2018-06-09 NOTE — Telephone Encounter (Signed)
Noted  

## 2018-06-13 ENCOUNTER — Other Ambulatory Visit: Payer: Self-pay | Admitting: Family Medicine

## 2018-06-13 DIAGNOSIS — E039 Hypothyroidism, unspecified: Secondary | ICD-10-CM

## 2018-06-13 NOTE — Telephone Encounter (Signed)
Requested Prescriptions  Pending Prescriptions Disp Refills  . levothyroxine (SYNTHROID, LEVOTHROID) 50 MCG tablet [Pharmacy Med Name: LEVOTHYROXINE 0.05MG  (50MCG) TAB] 30 tablet 1    Sig: TAKE 1 TABLET(50 MCG) BY MOUTH DAILY     Endocrinology:  Hypothyroid Agents Failed - 06/13/2018  3:47 AM      Failed - TSH needs to be rechecked within 3 months after an abnormal result. Refill until TSH is due.      Failed - TSH in normal range and within 360 days    TSH  Date Value Ref Range Status  04/18/2018 5.930 (H) 0.450 - 4.500 uIU/mL Final         Passed - Valid encounter within last 12 months    Recent Outpatient Visits          1 month ago Hypothyroidism, unspecified type   [facility], Ranell Patrick, MD   3 months ago Urinary tract infection without hematuria, site unspecified   [facility], Ranell Patrick, MD   3 months ago Urinary tract infection with hematuria, site unspecified   [facility], [place], MD   3 months ago Altered mental status, unspecified altered mental status type   [facility], Ranell Patrick, MD   4 months ago History of falling   [facility], Ranell Patrick, MD

## 2018-06-15 ENCOUNTER — Telehealth: Payer: Self-pay | Admitting: Family Medicine

## 2018-06-15 DIAGNOSIS — M25511 Pain in right shoulder: Secondary | ICD-10-CM | POA: Diagnosis not present

## 2018-06-15 DIAGNOSIS — I10 Essential (primary) hypertension: Secondary | ICD-10-CM | POA: Diagnosis not present

## 2018-06-15 NOTE — Telephone Encounter (Signed)
Left a msg for patient's son to call back to set up an office visit to be followed up with Dr Carlota Raspberry before he will set up for PT

## 2018-06-15 NOTE — Telephone Encounter (Signed)
Copied from [place] [phone] [id number]. Topic: General - Inquiry >> Jun 15, 2018 10:42 AM Virl Axe D wrote: Reason for CRM: Pt's son [name] stated at a prior OV Dr. Carlota Raspberry mentioned that pt needed to have some PT done. He would like to know if Dr. Carlota Raspberry can help getting this setup or suggest how to do this. Would like a CB. Please advise. CB#[phone]

## 2018-06-29 ENCOUNTER — Telehealth: Payer: Self-pay | Admitting: Neurology

## 2018-06-29 NOTE — Telephone Encounter (Signed)
Son calling in about the Escitalopram 10mg  tablet medication. Needing something stronger because she is staying awake all night. Please advise. Thanks!

## 2018-07-03 NOTE — Telephone Encounter (Signed)
Pls have him give the escitalopram in the morning. At her age, we have to be very careful with any medication started. We can try low dose Trazodone 50mg  1/2 tablet every night. Pls send in Rx, thanks

## 2018-07-04 NOTE — Telephone Encounter (Signed)
Returned call to pt son.  No answer.  LMOM asking for return call to relay message below.

## 2018-07-30 ENCOUNTER — Telehealth: Payer: Self-pay | Admitting: Neurology

## 2018-07-30 NOTE — Telephone Encounter (Signed)
Son calling in about patient sleeping problems. She cat naps throughout the day and then gets up a lot at night. She can't really sleep well. Please advise. Thanks!

## 2018-07-31 ENCOUNTER — Telehealth: Payer: Self-pay | Admitting: Neurology

## 2018-07-31 NOTE — Telephone Encounter (Signed)
Patient's son left message with the answering service regarding his mother being unable to sleep through the night. She is on a medication but it is not working. He would like to know if the medication can be increased? Please Call. Thanks

## 2018-07-31 NOTE — Telephone Encounter (Signed)
Son had called about this in the past, Meagen tried calling back but no answer. Pls convey:  Pls have him give the escitalopram in the morning. At her age, we have to be very careful with any medication started. We can try low dose Trazodone 50mg  1/2 tablet every night. Pls send in Rx if agreeable, thanks

## 2018-07-31 NOTE — Telephone Encounter (Signed)
Please advise 

## 2018-07-31 NOTE — Telephone Encounter (Signed)
Called patient's son and left message giving him instructions.  Requested for him to call me back to let me know what he would like to do.

## 2018-07-31 NOTE — Telephone Encounter (Signed)
The medication name is Escitalopram 10 MG. She is also getting up 4 - 5 times a night. Thanks

## 2018-08-02 NOTE — Telephone Encounter (Signed)
Looks like this was addressed in a phone message from yesterday.  See Dr. Amparo Bristol note re: trazodone

## 2018-08-06 ENCOUNTER — Telehealth: Payer: Self-pay

## 2018-08-06 NOTE — Telephone Encounter (Signed)
Pt son called back no answer voice mail left for pt son to call back

## 2018-08-06 NOTE — Telephone Encounter (Signed)
Pls have him give the escitalopram in the morning. At her age, we have to be very careful with any medication started. We can try low dose Trazodone50mg  1/2 tablet every night. Pls send in Rx if agreeable, thanks

## 2018-08-07 NOTE — Telephone Encounter (Signed)
Pt son called no answer, voice mail left for him to call back to inform him of what dr Delice Lesch advised.Marland Kitchen Pls have him give the escitalopram in the morning. At her age, we have to be very careful with any medication started. We can try low dose Trazodone50mg  1/2 tablet every night. Pls send in Rxif agreeable, thanks

## 2018-08-09 NOTE — Telephone Encounter (Signed)
Pt son called no answer, voice mail left for him to call back to inform him of what dr Delice Lesch advised.Marland KitchenPls have him give the escitalopram in the morning. At her age, we have to be very careful with any medication started. We can try low dose Trazodone50mg  1/2 tablet every night. Pls send in Rxif agreeable, thanks..pt son informed in voice mail that we can not start any medicaiton until we talk to and that we need for him to call the office

## 2018-08-09 NOTE — Telephone Encounter (Signed)
Pt son called no answer, voice mail left for him to call back to inform him of what dr Delice Lesch advised.Marland Kitchen Pls have him give the escitalopram in the morning. At her age, we have to be very careful with any medication started. We can try low dose Trazodone50mg  1/2 tablet every night. Pls send in Rxif agreeable, thanks..pt son informed in voice mail that we can not start any medicaiton until we talk to and that we need for him to call the office

## 2018-09-12 ENCOUNTER — Other Ambulatory Visit: Payer: Self-pay

## 2018-09-12 ENCOUNTER — Ambulatory Visit (INDEPENDENT_AMBULATORY_CARE_PROVIDER_SITE_OTHER): Payer: Medicare HMO | Admitting: Family Medicine

## 2018-09-12 VITALS — BP 130/80 | HR 96 | Temp 98.1°F | Resp 12 | Wt 131.0 lb

## 2018-09-12 DIAGNOSIS — F99 Mental disorder, not otherwise specified: Secondary | ICD-10-CM

## 2018-09-12 DIAGNOSIS — M25552 Pain in left hip: Secondary | ICD-10-CM | POA: Diagnosis not present

## 2018-09-12 DIAGNOSIS — F0390 Unspecified dementia without behavioral disturbance: Secondary | ICD-10-CM

## 2018-09-12 DIAGNOSIS — F5105 Insomnia due to other mental disorder: Secondary | ICD-10-CM

## 2018-09-12 DIAGNOSIS — R69 Illness, unspecified: Secondary | ICD-10-CM | POA: Diagnosis not present

## 2018-09-12 DIAGNOSIS — M25562 Pain in left knee: Secondary | ICD-10-CM | POA: Diagnosis not present

## 2018-09-12 DIAGNOSIS — M79672 Pain in left foot: Secondary | ICD-10-CM | POA: Diagnosis not present

## 2018-09-12 MED ORDER — MIRTAZAPINE 7.5 MG PO TABS: 7.5000 mg | ORAL_TABLET | Freq: Every day | ORAL | 1 refills | Status: DC

## 2018-09-12 NOTE — Patient Instructions (Addendum)
   Change the lexapro (escitalopram) to the morning. If still having difficulty with sleep, can fill the printed prescription for mirtazapine that may help with both mood symptoms and sleep.  That can increase appetite.  Overall reassuring exam today for left leg/hip/foot, but I did order x-rays to evaluate those areas further.  Recheck in 3 weeks to follow-up on above issues.  Return to the clinic or go to the nearest emergency room if any of your symptoms worsen or new symptoms occur.    If you have lab work done today you will be contacted with your lab results within the next 2 weeks.  If you have not heard from [place] then please contact us. The fastest way to get your results is to register for My Chart.   IF you received an x-ray today, you will receive an invoice from [facility]. Please contact [facility] at [phone] with questions or concerns regarding your invoice.   IF you received labwork today, you will receive an invoice from [facility]. Please contact [facility] at [phone] with questions or concerns regarding your invoice.   Our billing staff will not be able to assist you with questions regarding bills from these companies.  You will be contacted with the lab results as soon as they are available. The fastest way to get your results is to activate your My Chart account. Instructions are located on the last page of this paperwork. If you have not heard from [place] regarding the results in 2 weeks, please contact this office.

## 2018-09-12 NOTE — Progress Notes (Signed)
Subjective:    Patient ID: Sheri Miller, female    DOB: September 23, 1923, 85 y.o.   MRN: [id number]  HPI Sheri Miller is a 85 y.o. female Presents today for: Chief Complaint  Patient presents with   Fall    Son stated she woke up on June 15, 2018 in pain not sure what she done to her foot but she have been favoring her left foot. She went the Urgent care on March 6 they done xray and stated no fracutre but still favor left foot which gives him concer that she will fall again and hurt herself   memory issus    Having problem with memory. Fogetfull, keep asking the same questions   Insomnia    having trouble sleeping 3-4 days out of a week where she is up 1/2 the night. Have been seen by neurologist and they gave me med to help clear her mind but since this was not helping they stated she need to see pcp may need to bet on a medication for sleep.  Last visit with me in January for hospital follow-up.   L foot pain: Possible fall early in the morning on March 6th, but unwitnessed.  R hand sore initially, but that got better, no pain in hand. Seen at urgent care - had XR - no apparent fx. Still complaining of pain in foot with walking and favoring left leg. Has had some pain in L hip at times since fall about 6 months ago. fall about 6 months ago- seen at urgent care - had XR of hip, no apparent fx.  Still having some favoring of left hip.  Tx:  Has some meds at home - herbal meds in room.   Memory issues with insomnia. Followed by neurology, Dr. Delice Lesch.  She had been placed on Lexapro to help with mood symptoms with dementia when we discussed symptoms in January.  Thought to have component of Alzheimer's disease with MMSE 24 out of 30 when evaluated in December last year.  Decided against cholinesterase inhibitor. Multiple telephone notes reviewed from neurology since my last visit. Patient is remained awake throughout the night.  Advised to take the Lexapro in the morning, and option of  trazodone 25 mg nightly.  Sometimes sleeps during the night, but about 3-4 days per week - up multiple times per night. No new meds. Still taking lexapro at night.   6CIT Screen 09/12/2018 03/13/2018  What Year? 0 points 4 points  What month? 0 points 3 points  What time? 0 points 3 points  Count back from 20 0 points 0 points  Months in reverse 2 points 4 points  Repeat phrase 2 points 2 points  Total Score 4 16    MMSE - Mini Mental State Exam 04/05/2018  Orientation to time 4  Orientation to Place 4  Registration 3  Attention/ Calculation 5  Recall 0  Language- name 2 objects 2  Language- repeat 1  Language- follow 3 step command 3  Language- read & follow direction 1  Write a sentence 1  Copy design 0  Total score 24     There are no active problems to display for this patient.  Past Medical History:  Diagnosis Date   Hyperlipidemia    Hypertension    No past surgical history on file. No Known Allergies Prior to Admission medications   Medication Sig Start Date End Date Taking? Authorizing Provider  escitalopram (LEXAPRO) 10 MG tablet Take 1 tablet (10 mg  total) by mouth at bedtime. 04/05/18  Yes [name], MD  levothyroxine (SYNTHROID, LEVOTHROID) 50 MCG tablet TAKE 1 TABLET(50 MCG) BY MOUTH DAILY 06/13/18  Yes Wendie Agreste, MD  lisinopril (PRINIVIL,ZESTRIL) 20 MG tablet TK 1 T PO QD 04/18/18  Yes Wendie Agreste, MD   Social History   Socioeconomic History   Marital status: Single    Spouse name: Not on file   Number of children: Not on file   Years of education: Not on file   Highest education level: Not on file  Occupational History   Not on file  Social Needs   Financial resource strain: Not on file   Food insecurity:    Worry: Not on file    Inability: Not on file   Transportation needs:    Medical: Not on file    Non-medical: Not on file  Tobacco Use   Smoking status: Never Smoker   Smokeless tobacco: Never Used  Substance  and Sexual Activity   Alcohol use: Not on file   Drug use: Not on file   Sexual activity: Not on file  Lifestyle   Physical activity:    Days per week: Not on file    Minutes per session: Not on file   Stress: Not on file  Relationships   Social connections:    Talks on phone: Not on file    Gets together: Not on file    Attends religious service: Not on file    Active member of club or organization: Not on file    Attends meetings of clubs or organizations: Not on file    Relationship status: Not on file   Intimate partner violence:    Fear of current or ex partner: Not on file    Emotionally abused: Not on file    Physically abused: Not on file    Forced sexual activity: Not on file  Other Topics Concern   Not on file  Social History Narrative   Pt lives with her son and his family (wife and 2 sons) in 2 story home   Has 4 children   8th grade education   Retired Regulatory affairs officer     Review of Systems Per HPI.     Objective:   Physical Exam Constitutional:      General: She is not in acute distress.    Appearance: She is well-developed and normal weight. She is not ill-appearing.     Comments: Pleasant, but distractible with questions at times.  HENT:     Head: Normocephalic and atraumatic.  Cardiovascular:     Rate and Rhythm: Normal rate.  Pulmonary:     Effort: Pulmonary effort is normal.  Musculoskeletal:     Left hip: She exhibits normal range of motion, normal strength, no tenderness and no bony tenderness (Pain-free internal/external rotation, no focal bony tenderness.).     Left knee: She exhibits normal range of motion (Pain-free range of motion of knee) and no swelling. No tenderness found. No medial joint line and no lateral joint line tenderness noted.       Feet:  Neurological:     Mental Status: She is alert and oriented to person, place, and time.     Gait: Gait abnormal (Appears to favor his left hip slightly, but more so may have Trendelenburg  gait).  Psychiatric:        Attention and Perception: Attention normal.        Mood and Affect: Mood normal.  Speech: Speech is tangential (few times. ).        Behavior: Behavior normal. Behavior is cooperative.    Vitals:   09/12/18 1646  BP: 130/80  Pulse: 96  Resp: 12  Temp: 98.1 F (36.7 C)  TempSrc: Oral  SpO2: 96%  Weight: 131 lb (59.4 kg)   Mini-Cog - 09/12/18 1645    Normal clock drawing test?  yes    How many words correct?  3           Assessment & Plan:   Haunani Dickard is a 85 y.o. female Hip pain, left - Plan: DG HIP UNILAT W OR W/O PELVIS 2-3 VIEWS LEFT Arthralgia of left lower leg - Plan: DG Tibia/Fibula Left Left foot pain - Plan: DG Foot Complete Left  -Intermittent complaints of left foot pain and left hip pain per son.  Favoring of that leg at times, but overall reassuring exam of foot and hip.  Unlikely fracture given current exam but will repeat imaging of hip and foot.  Does have some tenderness along lower anterior tibial surface but no apparent soft tissue swelling, ecchymosis or wounds.  Will x-ray that area as well  -Examination of her gait, may be favoring left slightly, but appears to be more of a Trendelenburg gait which could indicate some abductor weakness on the right side.  Further work-up to be determined after imaging.  Plan follow-up next few weeks  Insomnia due to other mental disorder - Plan: mirtazapine (REMERON) 7.5 MG tablet  -Initially we will try to change Lexapro to morning dosing.  If that is not effective, then can try very low-dose Remeron 7.5 mg nightly.  Side effects discussed including appetite increase.  Additionally cautioned on serotonin syndrome signs and symptoms given combination with SSRI.  Understanding expressed from son with ER precautions.  Dementia without behavioral disturbance, unspecified dementia type ([place])  -Change Lexapro to morning dosing as above, continue follow-up with neurology.  Meds ordered this  encounter  Medications   mirtazapine (REMERON) 7.5 MG tablet    Sig: Take 1 tablet (7.5 mg total) by mouth at bedtime.    Dispense:  30 tablet    Refill:  1   Patient Instructions     Change the lexapro (escitalopram) to the morning. If still having difficulty with sleep, can fill the printed prescription for mirtazapine that may help with both mood symptoms and sleep.  That can increase appetite.  Overall reassuring exam today for left leg/hip/foot, but I did order x-rays to evaluate those areas further.  Recheck in 3 weeks to follow-up on above issues.  Return to the clinic or go to the nearest emergency room if any of your symptoms worsen or new symptoms occur.    If you have lab work done today you will be contacted with your lab results within the next 2 weeks.  If you have not heard from [place] then please contact us. The fastest way to get your results is to register for My Chart.   IF you received an x-ray today, you will receive an invoice from [facility]. Please contact [facility] at [phone] with questions or concerns regarding your invoice.   IF you received labwork today, you will receive an invoice from [place]. Please contact [facility] at [phone] with questions or concerns regarding your invoice.   Our billing staff will not be able to assist you with questions regarding bills from these companies.  You will be contacted with the lab results  as soon as they are available. The fastest way to get your results is to activate your My Chart account. Instructions are located on the last page of this paperwork. If you have not heard from [place] regarding the results in 2 weeks, please contact this office.      Signed,   Merri Ray, MD [facility].  09/13/18 8:59 PM

## 2018-09-13 ENCOUNTER — Encounter: Payer: Self-pay | Admitting: Family Medicine

## 2018-09-13 ENCOUNTER — Ambulatory Visit
Admission: RE | Admit: 2018-09-13 | Discharge: 2018-09-13 | Disposition: A | Discharge status: Home / Self Care | Payer: Medicare HMO | Source: Ambulatory Visit | Attending: Family Medicine | Admitting: Family Medicine

## 2018-09-13 DIAGNOSIS — M25552 Pain in left hip: Secondary | ICD-10-CM | POA: Diagnosis not present

## 2018-09-13 DIAGNOSIS — M79672 Pain in left foot: Secondary | ICD-10-CM

## 2018-09-13 DIAGNOSIS — M79605 Pain in left leg: Secondary | ICD-10-CM | POA: Diagnosis not present

## 2018-09-13 DIAGNOSIS — M25562 Pain in left knee: Secondary | ICD-10-CM

## 2018-09-13 DIAGNOSIS — R296 Repeated falls: Secondary | ICD-10-CM | POA: Diagnosis not present

## 2018-09-24 ENCOUNTER — Other Ambulatory Visit: Payer: Self-pay | Admitting: Family Medicine

## 2018-09-24 DIAGNOSIS — E039 Hypothyroidism, unspecified: Secondary | ICD-10-CM

## 2018-10-16 ENCOUNTER — Other Ambulatory Visit: Payer: Self-pay | Admitting: Family Medicine

## 2018-10-16 DIAGNOSIS — I10 Essential (primary) hypertension: Secondary | ICD-10-CM

## 2018-10-20 ENCOUNTER — Telehealth: Payer: Self-pay | Admitting: Family Medicine

## 2018-10-20 DIAGNOSIS — E039 Hypothyroidism, unspecified: Secondary | ICD-10-CM

## 2018-10-24 ENCOUNTER — Other Ambulatory Visit: Payer: Self-pay

## 2018-10-24 ENCOUNTER — Encounter: Payer: Self-pay | Admitting: Neurology

## 2018-10-24 ENCOUNTER — Telehealth (INDEPENDENT_AMBULATORY_CARE_PROVIDER_SITE_OTHER): Payer: Medicare HMO | Admitting: Neurology

## 2018-10-24 VITALS — Ht 60.0 in | Wt 131.0 lb

## 2018-10-24 DIAGNOSIS — F0281 Dementia in other diseases classified elsewhere with behavioral disturbance: Secondary | ICD-10-CM

## 2018-10-24 DIAGNOSIS — F03B18 Unspecified dementia, moderate, with other behavioral disturbance: Secondary | ICD-10-CM

## 2018-10-24 DIAGNOSIS — F0391 Unspecified dementia with behavioral disturbance: Secondary | ICD-10-CM

## 2018-10-24 DIAGNOSIS — R69 Illness, unspecified: Secondary | ICD-10-CM | POA: Diagnosis not present

## 2018-10-24 DIAGNOSIS — G309 Alzheimer's disease, unspecified: Secondary | ICD-10-CM | POA: Diagnosis not present

## 2018-10-24 DIAGNOSIS — F0390 Unspecified dementia without behavioral disturbance: Secondary | ICD-10-CM | POA: Insufficient documentation

## 2018-10-24 NOTE — Telephone Encounter (Signed)
Pt son called in and stated she is completely out of this med.  He stated she was just in to see Dr in June, he does not understand as to why this one can not be refilled.  I told him that she may need an appt , please advise  Best number  [phone]
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
## 2018-10-25 ENCOUNTER — Telehealth: Payer: Self-pay | Admitting: Family Medicine

## 2018-10-25 ENCOUNTER — Other Ambulatory Visit: Payer: Self-pay | Admitting: Emergency Medicine

## 2018-10-25 ENCOUNTER — Other Ambulatory Visit: Payer: Self-pay | Admitting: Family Medicine

## 2018-10-25 DIAGNOSIS — E039 Hypothyroidism, unspecified: Secondary | ICD-10-CM

## 2018-10-25 MED ORDER — LEVOTHYROXINE SODIUM 50 MCG PO TABS: ORAL_TABLET | ORAL | 0 refills | Status: DC

## 2018-10-25 NOTE — Telephone Encounter (Signed)
Pt has been out of thyroid medication for three days and is requesting courtesy refill until next appointment. Needs refill done today

## 2018-10-26 NOTE — Telephone Encounter (Signed)
Pt refill has been sent

## 2018-10-29 ENCOUNTER — Ambulatory Visit (INDEPENDENT_AMBULATORY_CARE_PROVIDER_SITE_OTHER): Payer: Medicare HMO

## 2018-10-29 ENCOUNTER — Ambulatory Visit (INDEPENDENT_AMBULATORY_CARE_PROVIDER_SITE_OTHER): Payer: Medicare HMO | Admitting: Family Medicine

## 2018-10-29 ENCOUNTER — Encounter: Payer: Self-pay | Admitting: Family Medicine

## 2018-10-29 ENCOUNTER — Other Ambulatory Visit: Payer: Self-pay

## 2018-10-29 VITALS — BP 120/65 | HR 51 | Temp 98.5°F | Ht 63.0 in | Wt 125.8 lb

## 2018-10-29 DIAGNOSIS — E039 Hypothyroidism, unspecified: Secondary | ICD-10-CM | POA: Diagnosis not present

## 2018-10-29 DIAGNOSIS — M47816 Spondylosis without myelopathy or radiculopathy, lumbar region: Secondary | ICD-10-CM | POA: Diagnosis not present

## 2018-10-29 DIAGNOSIS — F0391 Unspecified dementia with behavioral disturbance: Secondary | ICD-10-CM

## 2018-10-29 DIAGNOSIS — R269 Unspecified abnormalities of gait and mobility: Secondary | ICD-10-CM

## 2018-10-29 DIAGNOSIS — I10 Essential (primary) hypertension: Secondary | ICD-10-CM | POA: Diagnosis not present

## 2018-10-29 DIAGNOSIS — R69 Illness, unspecified: Secondary | ICD-10-CM | POA: Diagnosis not present

## 2018-10-29 DIAGNOSIS — M4316 Spondylolisthesis, lumbar region: Secondary | ICD-10-CM | POA: Diagnosis not present

## 2018-10-29 DIAGNOSIS — M5126 Other intervertebral disc displacement, lumbar region: Secondary | ICD-10-CM | POA: Diagnosis not present

## 2018-10-29 MED ORDER — LEVOTHYROXINE SODIUM 50 MCG PO TABS: ORAL_TABLET | ORAL | 1 refills | Status: DC

## 2018-10-29 MED ORDER — ESCITALOPRAM OXALATE 10 MG PO TABS: 10.0000 mg | ORAL_TABLET | Freq: Every day | ORAL | 1 refills | Status: DC

## 2018-10-29 MED ORDER — LISINOPRIL 20 MG PO TABS: ORAL_TABLET | ORAL | 1 refills | Status: DC

## 2018-10-29 NOTE — Patient Instructions (Addendum)
    I will refer you to physical therapy to evaluate the shift in the hips when you walk.  I think that will be helpful.  Follow-up in the next 6 weeks to discuss further work-up if needed.  That can be a telemedicine visit by video.  I will check the electrolytes and thyroid test, but no change in medications for now.  Please let me know if there are questions and take care.   Return to the clinic or go to the nearest emergency room if any of your symptoms worsen or new symptoms occur.   If you have lab work done today you will be contacted with your lab results within the next 2 weeks.  If you have not heard from [place] then please contact us. The fastest way to get your results is to register for My Chart.   IF you received an x-ray today, you will receive an invoice from [facility]. Please contact [facility] at [phone] with questions or concerns regarding your invoice.   IF you received labwork today, you will receive an invoice from [place]. Please contact [facility] at [phone] with questions or concerns regarding your invoice.   Our billing staff will not be able to assist you with questions regarding bills from these companies.  You will be contacted with the lab results as soon as they are available. The fastest way to get your results is to activate your My Chart account. Instructions are located on the last page of this paperwork. If you have not heard from [place] regarding the results in 2 weeks, please contact this office.

## 2018-10-29 NOTE — Progress Notes (Signed)
Subjective:    Patient ID: Sheri Miller, female    DOB: Jul 17, 1923, 85 y.o.   MRN: [id number]  HPI Sheri Miller is a 85 y.o. female Presents today for: Chief Complaint  Patient presents with   Medication Refill   Hypertension: BP Readings from Last 3 Encounters:  10/29/18 (!) 142/52  09/12/18 130/80  04/18/18 (!) 164/70   Lab Results  Component Value Date   CREATININE 1.18 (H) 03/13/2018  lisinopril 5mg  qd. No home readings.  No cp/dyspnea, no new side effects of meds.   Hypothyroidism: Lab Results  Component Value Date   TSH 5.930 (H) 04/18/2018  synthroid 51mcg qd.  Ran out for 3 days last week.   Dementia Takes Lexapro for mood symptoms.  Changed to morning dosing at last visit due to some insomnia.  Option of low-dose Remeron if needed.  Serotonin syndrome precautions discussed. Has been sleeping well lately. Not needing remeron.  lexapro in am.   Antalgic gait: Discussed last visit in June.  X-ray without concerning features, appears to be a Trendelenburg gait with possible hip abductor weakness on the right side.  X-ray indicated mild degenerative changes in the left hip but no acute abnormality, and no acute fracture/abnormalities of left tib-fib or left foot x-rays. No back pain.  Walked with son last year, into fall. Took winter off. Restarted walking this spring, noticed change in walking since that time.  No recent falls.  No new incontinence, no LE weakness.     Patient Active Problem List   Diagnosis Date Noted   Dementia ([place]) 10/24/2018   Past Medical History:  Diagnosis Date   Hyperlipidemia    Hypertension    No past surgical history on file. No Known Allergies Prior to Admission medications   Medication Sig Start Date End Date Taking? Authorizing Provider  escitalopram (LEXAPRO) 10 MG tablet Take 1 tablet (10 mg total) by mouth at bedtime. 04/05/18   [name], MD  levothyroxine (SYNTHROID) 50 MCG tablet TAKE 1 TABLET(50 MCG) BY  MOUTH DAILY 10/25/18   Wendie Agreste, MD  lisinopril (ZESTRIL) 20 MG tablet TAKE 1 TABLET BY MOUTH EVERY DAY 10/16/18   Wendie Agreste, MD   Social History   Socioeconomic History   Marital status: Single    Spouse name: Not on file   Number of children: 4   Years of education: Not on file   Highest education level: Not on file  Occupational History   Not on file  Social Needs   Financial resource strain: Not on file   Food insecurity    Worry: Not on file    Inability: Not on file   Transportation needs    Medical: Not on file    Non-medical: Not on file  Tobacco Use   Smoking status: Never Smoker   Smokeless tobacco: Never Used  Substance and Sexual Activity   Alcohol use: Never    Frequency: Never   Drug use: Never   Sexual activity: Not on file  Lifestyle   Physical activity    Days per week: Not on file    Minutes per session: Not on file   Stress: Not on file  Relationships   Social connections    Talks on phone: Not on file    Gets together: Not on file    Attends religious service: Not on file    Active member of club or organization: Not on file    Attends meetings of clubs or  organizations: Not on file    Relationship status: Not on file   Intimate partner violence    Fear of current or ex partner: Not on file    Emotionally abused: Not on file    Physically abused: Not on file    Forced sexual activity: Not on file  Other Topics Concern   Not on file  Social History Narrative   Pt lives with her son and his family (wife and 2 sons) in 2 story home   Has 4 children   8th grade education   Retired Regulatory affairs officer     Review of Systems     Objective:   Physical Exam Vitals signs reviewed.  Constitutional:      Appearance: She is well-developed.  HENT:     Head: Normocephalic and atraumatic.  Eyes:     Conjunctiva/sclera: Conjunctivae normal.     Pupils: Pupils are equal, round, and reactive to light.  Neck:     Vascular:  No carotid bruit.  Cardiovascular:     Rate and Rhythm: Normal rate and regular rhythm.     Heart sounds: Normal heart sounds.  Pulmonary:     Effort: Pulmonary effort is normal.     Breath sounds: Normal breath sounds.  Abdominal:     Palpations: Abdomen is soft. There is no pulsatile mass.     Tenderness: There is no abdominal tenderness.  Musculoskeletal:        General: No swelling, tenderness or deformity.     Comments: Lumbar spine nontender, pain-free range of motion hips bilaterally, equal hip abduction, adduction, flexion, extension with gross testing.  Negative seated straight leg raise, reflexes 2+ patella/achilles bilaterally.   Antalgic gait, with left hip drop.  Ambulates without assistance  Skin:    General: Skin is warm and dry.  Neurological:     Mental Status: She is alert and oriented to person, place, and time.  Psychiatric:        Behavior: Behavior normal.    Vitals:   10/29/18 1328 10/29/18 1430  BP: (!) 142/52 120/65  Pulse: (!) 51   Temp: 98.5 F (36.9 C)   TempSrc: Oral   SpO2: 97%   Weight: 125 lb 12.8 oz (57.1 kg)   Height: 5\' 3"  (1.6 m)     Dg Lumbar Spine 2-3 Views  Result Date: 10/29/2018 CLINICAL DATA:  Trendelenburg gait. Evaluate for degenerative change lumbar spine. EXAM: LUMBAR SPINE - 2-3 VIEW COMPARISON:  None. FINDINGS: Subtle curvature of the lumbar spine convex left. Vertebral body heights are maintained. There is mild spondylosis throughout the lumbar spine to include moderate facet arthropathy. Mild grade 1 anterolisthesis of L3 on L4 likely due to the moderate facet arthropathy. Significant disc space narrowing at the L3-4, L4-5 and L5-S1 levels. Disc space narrowing at the T12-L1 level. Moderate calcified plaque over the abdominal aorta. Evidence of moderate gallstones. IMPRESSION: Mild spondylosis of the lumbar spine to include moderate facet arthropathy. Moderate multilevel disc disease from the L3-4 level to the L5-S1 level and also  at the T12-L1 level. Grade 1 anterolisthesis of L3 on L4 due to the moderate facet arthropathy. Cholelithiasis. Aortic atherosclerosis. Electronically Signed   By: Marin Olp M.D.   On: 10/29/2018 14:42       Assessment & Plan:   Sheri Miller is a 85 y.o. female Dementia with behavioral disturbance, unspecified dementia type ([place]) - Plan: escitalopram (LEXAPRO) 10 MG tablet  -Stable with morning dosing of Lexapro, sleep has improved.  Continue same with option of low-dose low-dose Remeron if needed.  Hypothyroidism, unspecified type - Plan: levothyroxine (SYNTHROID) 50 MCG tablet, TSH + free T4  -Borderline TSH prior.  Repeat testing with free T4, continue same dose Synthroid for now.  Essential hypertension - Plan: lisinopril (ZESTRIL) 20 MG tablet, Basic metabolic panel  -Stable on recheck, continue lisinopril  Abnormality of gait - Plan: Ambulatory referral to Physical Therapy, DG Lumbar Spine 2-3 Views  -Degenerative disc disease with degenerative grade 1 spondylolisthesis noted on x-ray.  Possible Trendelenburg gait from abductor weakness.  No known injury.  Treatment options discussed including orthopedics eval versus trial of physical therapy.  Would like to try PT first.  Referral placed, follow-up in 6 weeks.  Sooner if pain or worsening symptoms   Meds ordered this encounter  Medications   levothyroxine (SYNTHROID) 50 MCG tablet    Sig: TAKE 1 TABLET(50 MCG) BY MOUTH DAILY    Dispense:  90 tablet    Refill:  1   lisinopril (ZESTRIL) 20 MG tablet    Sig: TAKE 1 TABLET BY MOUTH EVERY DAY    Dispense:  90 tablet    Refill:  1   escitalopram (LEXAPRO) 10 MG tablet    Sig: Take 1 tablet (10 mg total) by mouth at bedtime.    Dispense:  90 tablet    Refill:  1   Patient Instructions      I will refer you to physical therapy to evaluate the shift in the hips when you walk.  I think that will be helpful.  Follow-up in the next 6 weeks to discuss further work-up if  needed.  That can be a telemedicine visit by video.  I will check the electrolytes and thyroid test, but no change in medications for now.  Please let me know if there are questions and take care.   Return to the clinic or go to the nearest emergency room if any of your symptoms worsen or new symptoms occur.    If you have lab work done today you will be contacted with your lab results within the next 2 weeks.  If you have not heard from [place] then please contact us. The fastest way to get your results is to register for My Chart.   IF you received an x-ray today, you will receive an invoice from [facility]. Please contact [facility] at [phone] with questions or concerns regarding your invoice.   IF you received labwork today, you will receive an invoice from [facility]. Please contact [facility] at [phone] with questions or concerns regarding your invoice.   Our billing staff will not be able to assist you with questions regarding bills from these companies.  You will be contacted with the lab results as soon as they are available. The fastest way to get your results is to activate your My Chart account. Instructions are located on the last page of this paperwork. If you have not heard from [place] regarding the results in 2 weeks, please contact this office.

## 2018-10-30 LAB — TSH+FREE T4
Free T4: 0.8 ng/dL — ABNORMAL LOW (ref 0.82–1.77)
TSH: 5.14 u[IU]/mL — ABNORMAL HIGH (ref 0.450–4.500)

## 2018-10-30 LAB — BASIC METABOLIC PANEL WITH GFR
BUN/Creatinine Ratio: 22 (ref 12–28)
BUN: 34 mg/dL (ref 10–36)
CO2: 20 mmol/L (ref 20–29)
Calcium: 9.2 mg/dL (ref 8.7–10.3)
Chloride: 103 mmol/L (ref 96–106)
Creatinine, Ser: 1.54 mg/dL — ABNORMAL HIGH (ref 0.57–1.00)
GFR calc Af Amer: 33 mL/min/1.73 — ABNORMAL LOW
GFR calc non Af Amer: 29 mL/min/1.73 — ABNORMAL LOW
Glucose: 91 mg/dL (ref 65–99)
Potassium: 4.8 mmol/L (ref 3.5–5.2)
Sodium: 139 mmol/L (ref 134–144)

## 2018-11-06 NOTE — Progress Notes (Signed)
letter sent

## 2018-11-07 ENCOUNTER — Ambulatory Visit: Payer: Medicare HMO

## 2018-11-08 ENCOUNTER — Ambulatory Visit: Payer: Medicare HMO | Attending: Family Medicine | Admitting: Physical Therapy

## 2018-11-08 ENCOUNTER — Encounter: Payer: Self-pay | Admitting: Physical Therapy

## 2018-11-08 ENCOUNTER — Other Ambulatory Visit: Payer: Self-pay

## 2018-11-08 DIAGNOSIS — R2689 Other abnormalities of gait and mobility: Secondary | ICD-10-CM

## 2018-11-08 DIAGNOSIS — R262 Difficulty in walking, not elsewhere classified: Secondary | ICD-10-CM | POA: Diagnosis not present

## 2018-11-08 NOTE — Therapy (Signed)
[place], [place], [address] Phone: [phone]   Fax:  [phone]  Physical Therapy Evaluation  Patient Details  Name: Sheri Miller MRN: [id number] Date of Birth: 05-May-1923 Referring Provider (PT): Wendie Agreste, MD   Encounter Date: 11/08/2018  PT End of Session - 11/08/18 1413    Visit Number  1    Number of Visits  13    Date for PT Re-Evaluation  12/21/18    Authorization Type  aetna MCR    PT Start Time  1407    PT Stop Time  1449    PT Time Calculation (min)  42 min    Activity Tolerance  Patient tolerated treatment well    Behavior During Therapy  [facility] for tasks assessed/performed       Past Medical History:  Diagnosis Date  . Hyperlipidemia   . Hypertension     History reviewed. No pertinent surgical history.  There were no vitals filed for this visit.   Subjective Assessment - 11/08/18 1409    Subjective  Son giving subjective history- Was walking 45 min-1 hour at the park last year and stopped for the winter but now can barely go for 20 min. tripped on a tree root and hit Rt hip/eye and tripped on the porch and hit her left hip and head. [place] out of bed and hit her leg- unsure of laterality. Pt reports Lt foot pain in the middle.Denies c/o N/T.    Patient Stated Goals  fix her walking    Currently in Pain?  No/denies         [facility] PT Assessment - 11/08/18 0001      Assessment   Medical Diagnosis  abnormality of gait    Referring Provider (PT)  Wendie Agreste, MD    Onset Date/Surgical Date  --   spring 2020   Hand Dominance  Left      Precautions   Precaution Comments  Dementia      Restrictions   Weight Bearing Restrictions  No      Home Environment   Living Environment  Private residence    Living Arrangements  Children    Available Help at Discharge  Family      Prior Function   Level of Independence  Independent with basic ADLs;Needs assistance with  homemaking;Needs assistance with gait    Vocation  Retired      Charity fundraiser Status  Within Functional Limits for tasks assessed      Observation/Other Assessments   Focus on Therapeutic Outcomes (FOTO)   43% limitation   by son, pt with dementia      Sensation   Additional Comments  WFL      Posture/Postural Control   Posture Comments  Rt ASIS elevation      ROM / Strength   AROM / PROM / Strength  Strength      Strength   Overall Strength Comments  gross LE strength 5/5      Palpation   SI assessment   WFL    Palpation comment  Rt LE 81.5 cm  Lt LE 81 cm      Ambulation/Gait   Gait Comments  Rt trendelenburg, single hand-hold assist for safety                Objective measurements completed on examination: See above findings.      [facility] Adult PT Treatment/Exercise - 11/08/18 0001  Exercises   Exercises  Knee/Hip      Knee/Hip Exercises: Standing   Heel Raises  15 reps    Hip Abduction  10 reps;Both    Hip Extension  10 reps;Both    Other Standing Knee Exercises  lateral weight shift      Knee/Hip Exercises: Seated   Sit to Sand  10 reps             PT Education - 11/08/18 1523    Education Details  anatomy of condition, POC, HEP, exercise form/rationale    Person(s) Educated  Patient;Child(ren)    Methods  Explanation;Demonstration;Tactile cues;Verbal cues;Handout    Comprehension  Verbalized understanding;Returned demonstration;Verbal cues required;Tactile cues required;Need further instruction       PT Short Term Goals - 11/08/18 1508      PT SHORT TERM GOAL #1   Title  Son, or other family member, will verbalize pt ability to perform exercises with assistance    Baseline  began creating HEP at eval    Time  3    Period  Weeks    Status  New    Target Date  11/30/18        PT Long Term Goals - 11/08/18 1509      PT LONG TERM GOAL #1   Title  Able to demo SLS with level pelvs    Baseline  trendelenburg  in Rt stance    Time  6    Period  Weeks    Status  New    Target Date  12/21/18      PT LONG TERM GOAL #2   Title  Pt will be able to walk for at least 30 minutes comfortably for exercise    Baseline  son reports significant fatigue noted after 20 min    Time  6    Period  Weeks    Status  New    Target Date  12/21/18      PT LONG TERM GOAL #3   Title  TUG <15s to indicate reduced risk for fall    Baseline  to be measured at next visit    Time  6    Period  Weeks    Status  New    Target Date  12/21/18             Plan - 11/08/18 1437    Clinical Impression Statement  3 layer lift in Left shoe due to LLD. Pt presents to PT with son who provided most of the info due to Dementia, repeated questions about her age and if I was the one who saw her last time multiple times. Pt is hard of hearing but is very pleasant and excited about exercising. Rt trendelenburg in gait which is odd as her hip MMT were 5/5. able to demonstrate lateral weight shifting without hip pop. LLD noted and 3 layer lift placed in Lt shoe which decreased severity of Trendelenburg. the only pain pt reports is in her Left midfoot after falling and twisting it an unknown date recently. Pt will benefit from skilled PT in order to retrain gait and decrease risk of falls.    Personal Factors and Comorbidities  Behavior Pattern    Examination-Activity Limitations  Locomotion Level;Transfers;Sit;Squat;Stand    Examination-Participation Restrictions  Community Activity   exercise   Stability/Clinical Decision Making  Evolving/Moderate complexity    Clinical Decision Making  Moderate    Rehab Potential  Good    PT Frequency  2x / week    PT Duration  6 weeks    PT Treatment/Interventions  ADLs/Self Care Home Management;Cryotherapy;Electrical Stimulation;Moist Heat;Traction;Balance training;Therapeutic exercise;Therapeutic activities;Functional mobility training;Stair training;Gait training;Neuromuscular  re-education;Patient/family education;Dry needling;Passive range of motion;Manual techniques    PT Next Visit Plan  TUG for goal, gross stretching that she can do independently, side stepping, nu step    PT Home Exercise Plan  sit<>stand, lateral weight shift, standing hip ext & abd, heel raises    Consulted and Agree with Plan of Care  Patient       Patient will benefit from skilled therapeutic intervention in order to improve the following deficits and impairments:  Abnormal gait, Difficulty walking, Decreased activity tolerance, Pain, Decreased balance, Improper body mechanics, Postural dysfunction  Visit Diagnosis: 1. Other abnormalities of gait and mobility   2. Difficulty in walking, not elsewhere classified        Problem List Patient Active Problem List   Diagnosis Date Noted  . Dementia ([place]) 10/24/2018   Elvia Aydin C. Carollyn Etcheverry PT, DPT 11/08/18 3:25 PM   [facility] [address] [place], [place], [address] Phone: [phone]   Fax:  [phone]  Name: Sheri Miller MRN: [id number] Date of Birth: [birth year]

## 2018-11-23 ENCOUNTER — Telehealth: Payer: Self-pay

## 2018-11-23 NOTE — Telephone Encounter (Signed)
L/m for pt to c/b re labs.  OK to release labs and provider instructions.  Pt needs labs rechecked approx 09/01.

## 2018-11-26 ENCOUNTER — Ambulatory Visit: Payer: Medicare HMO | Attending: Family Medicine | Admitting: Physical Therapy

## 2018-11-26 ENCOUNTER — Other Ambulatory Visit: Payer: Self-pay

## 2018-11-26 ENCOUNTER — Encounter: Payer: Self-pay | Admitting: Physical Therapy

## 2018-11-26 DIAGNOSIS — R262 Difficulty in walking, not elsewhere classified: Secondary | ICD-10-CM | POA: Insufficient documentation

## 2018-11-26 DIAGNOSIS — R2689 Other abnormalities of gait and mobility: Secondary | ICD-10-CM | POA: Insufficient documentation

## 2018-11-26 NOTE — Therapy (Signed)
[place], [place], [address] Phone: [phone]   Fax:  [phone]  Physical Therapy Treatment  Patient Details  Name: Sheri Miller MRN: [id number] Date of Birth: Oct 31, 1923 Referring Provider (PT): Sheri Agreste, MD   Encounter Date: 11/26/2018  PT End of Session - 11/26/18 0722    Visit Number  2    Number of Visits  13    Date for PT Re-Evaluation  12/21/18    Authorization Type  aetna MCR    PT Start Time  0715    PT Stop Time  0758    PT Time Calculation (min)  43 min       Past Medical History:  Diagnosis Date  . Hyperlipidemia   . Hypertension     History reviewed. No pertinent surgical history.  There were no vitals filed for this visit.  Subjective Assessment - 11/26/18 0735    Subjective  No pain. Son reports she c/o left shoulder pain with sit-stands at home during reach forward.    Currently in Pain?  No/denies         [facility] PT Assessment - 11/26/18 0001      Standardized Balance Assessment   Standardized Balance Assessment  Timed Up and Go Test      Timed Up and Go Test   TUG  Normal TUG    Normal TUG (seconds)  21    TUG Comments  without AD and CGA                    [facility] Adult PT Treatment/Exercise - 11/26/18 0001      Knee/Hip Exercises: Standing   Heel Raises  20 reps    Hip Abduction  20 reps    Abduction Limitations  also with RLE in 4 inch step     Hip Extension  20 reps    Extension Limitations  also with RLE on 4 inch step     Other Standing Knee Exercises  lateral weight shift, hip hikes from 4 inch step      Other Standing Knee Exercises  Tandem  stance       Knee/Hip Exercises: Seated   Sit to Sand  10 reps   2 sets     Knee/Hip Exercises: Supine   Bridges  20 reps    Single Leg Bridge  10 reps    Other Supine Knee/Hip Exercises  bilat and single clam green band x 20 each              PT Education - 11/26/18 0937    Education Details  HEP    Person(s) Educated  Patient    Methods  Explanation;Handout    Comprehension  Verbalized understanding       PT Short Term Goals - 11/08/18 1508      PT SHORT TERM GOAL #1   Title  Son, or other family member, will verbalize pt ability to perform exercises with assistance    Baseline  began creating HEP at eval    Time  3    Period  Weeks    Status  New    Target Date  11/30/18        PT Long Term Goals - 11/08/18 1509      PT LONG TERM GOAL #1   Title  Able to demo SLS with level pelvs    Baseline  trendelenburg in Rt stance    Time  6    Period  Weeks    Status  New    Target Date  12/21/18      PT LONG TERM GOAL #2   Title  Pt will be able to walk for at least 30 minutes comfortably for exercise    Baseline  son reports significant fatigue noted after 20 min    Time  6    Period  Weeks    Status  New    Target Date  12/21/18      PT LONG TERM GOAL #3   Title  TUG <15s to indicate reduced risk for fall    Baseline  to be measured at next visit    Time  6    Period  Weeks    Status  New    Target Date  12/21/18            Plan - 11/26/18 0718    Clinical Impression Statement  21 s TUG, no AD, CGA. Pt enters without AD and scissor gaitwith trendelenberg. CGA provided for safety. Reviewed HEP and she did well. She follows directions from her son well and he reports good compliance with HEP. Marland Kitchen Used 4 inch step to work on leveling Hips without LLE support. BEgan tandem stance. Updated HEP  with Tandem and supine hip stability. Difficulty with single leg bridging.    PT Next Visit Plan  gross stretching that she can do independently, side stepping, nu step, single leg bridge    PT Home Exercise Plan  sit<>stand, lateral weight shift, standing hip ext & abd, heel raises, tandem stance, bridge , green band clam supine       Patient will benefit from skilled therapeutic intervention in order to improve the following deficits and  impairments:  Abnormal gait, Difficulty walking, Decreased activity tolerance, Pain, Decreased balance, Improper body mechanics, Postural dysfunction  Visit Diagnosis: 1. Other abnormalities of gait and mobility   2. Difficulty in walking, not elsewhere classified        Problem List Patient Active Problem List   Diagnosis Date Noted  . Dementia ([place]) 10/24/2018    Dorene Ar, PTA 11/26/2018, 9:57 AM  [facility] [address] [place], [place], [address] Phone: [phone]   Fax:  [phone]  Name: Sheri Miller MRN: [id number] Date of Birth: 1924/03/31

## 2018-11-28 ENCOUNTER — Other Ambulatory Visit: Payer: Self-pay

## 2018-11-28 ENCOUNTER — Encounter: Payer: Self-pay | Admitting: Physical Therapy

## 2018-11-28 ENCOUNTER — Ambulatory Visit: Payer: Medicare HMO | Admitting: Physical Therapy

## 2018-11-28 DIAGNOSIS — R262 Difficulty in walking, not elsewhere classified: Secondary | ICD-10-CM

## 2018-11-28 DIAGNOSIS — R2689 Other abnormalities of gait and mobility: Secondary | ICD-10-CM

## 2018-11-28 NOTE — Therapy (Signed)
[place], [place], [address] Phone: [phone]   Fax:  [phone]  Physical Therapy Treatment  Patient Details  Name: Sheri Miller MRN: [id number] Date of Birth: 03-Jan-1924 Referring Provider (PT): Wendie Agreste, MD   Encounter Date: 11/28/2018  PT End of Session - 11/28/18 0721    Visit Number  3    Number of Visits  13    Date for PT Re-Evaluation  12/21/18    Authorization Type  aetna MCR    PT Start Time  0715    PT Stop Time  0800    PT Time Calculation (min)  45 min       Past Medical History:  Diagnosis Date  . Hyperlipidemia   . Hypertension     History reviewed. No pertinent surgical history.  There were no vitals filed for this visit.  Subjective Assessment - 11/28/18 0721    Subjective  No pain.    Currently in Pain?  No/denies                       [facility] Adult PT Treatment/Exercise - 11/28/18 0001      Knee/Hip Exercises: Standing   Heel Raises  20 reps    Hip Abduction  20 reps    Abduction Limitations  also with RLE in 4 inch step     Hip Extension  20 reps    Extension Limitations  also with RLE on 4 inch step     Other Standing Knee Exercises  lateral weight shift, hip hikes from 4 inch step      Other Standing Knee Exercises  Tandem  stance , side stepping with HHA cues for level pelvis       Knee/Hip Exercises: Seated   Sit to Sand  10 reps   2 sets     Knee/Hip Exercises: Supine   Bridges  20 reps    Bridges with [employer]  --    Single Leg Bridge  10 reps   2 sets   Other Supine Knee/Hip Exercises  bilat and single clam blue  band x 20 each                PT Short Term Goals - 11/08/18 1508      PT SHORT TERM GOAL #1   Title  Son, or other family member, will verbalize pt ability to perform exercises with assistance    Baseline  began creating HEP at eval    Time  3    Period  Weeks    Status  New    Target Date   11/30/18        PT Long Term Goals - 11/08/18 1509      PT LONG TERM GOAL #1   Title  Able to demo SLS with level pelvs    Baseline  trendelenburg in Rt stance    Time  6    Period  Weeks    Status  New    Target Date  12/21/18      PT LONG TERM GOAL #2   Title  Pt will be able to walk for at least 30 minutes comfortably for exercise    Baseline  son reports significant fatigue noted after 20 min    Time  6    Period  Weeks    Status  New    Target Date  12/21/18  PT LONG TERM GOAL #3   Title  TUG <15s to indicate reduced risk for fall    Baseline  to be measured at next visit    Time  6    Period  Weeks    Status  New    Target Date  12/21/18            Plan - 11/28/18 0804    Clinical Impression Statement  Max cues throughout session for level pelvis. Able to correct intermittently.    PT Next Visit Plan  gross stretching that she can do independently, side stepping, nu step, single leg bridge    PT Home Exercise Plan  sit<>stand, lateral weight shift, standing hip ext & abd, heel raises, tandem stance, bridge , green band clam supine       Patient will benefit from skilled therapeutic intervention in order to improve the following deficits and impairments:  Abnormal gait, Difficulty walking, Decreased activity tolerance, Pain, Decreased balance, Improper body mechanics, Postural dysfunction  Visit Diagnosis: 1. Other abnormalities of gait and mobility   2. Difficulty in walking, not elsewhere classified        Problem List Patient Active Problem List   Diagnosis Date Noted  . Dementia ([place]) 10/24/2018    Dorene Ar, PTA 11/28/2018, 8:22 AM  [facility] [address] [place], [place], [address] Phone: [phone]   Fax:  [phone]  Name: Sheri Miller MRN: [id number] Date of Birth: 1923/06/25

## 2018-12-05 ENCOUNTER — Other Ambulatory Visit: Payer: Self-pay

## 2018-12-05 ENCOUNTER — Ambulatory Visit: Payer: Medicare HMO | Admitting: Physical Therapy

## 2018-12-05 ENCOUNTER — Encounter: Payer: Self-pay | Admitting: Physical Therapy

## 2018-12-05 DIAGNOSIS — H903 Sensorineural hearing loss, bilateral: Secondary | ICD-10-CM | POA: Diagnosis not present

## 2018-12-05 DIAGNOSIS — R2689 Other abnormalities of gait and mobility: Secondary | ICD-10-CM | POA: Diagnosis not present

## 2018-12-05 DIAGNOSIS — E039 Hypothyroidism, unspecified: Secondary | ICD-10-CM | POA: Diagnosis not present

## 2018-12-05 DIAGNOSIS — R262 Difficulty in walking, not elsewhere classified: Secondary | ICD-10-CM

## 2018-12-05 NOTE — Therapy (Signed)
[place], [place], [address] Phone: [phone]   Fax:  [phone]  Physical Therapy Treatment  Patient Details  Name: Sheri Miller MRN: [id number] Date of Birth: [birth year] Referring Provider (PT): Wendie Agreste, MD   Encounter Date: 12/05/2018  PT End of Session - 12/05/18 0720    Visit Number  4    Number of Visits  13    Date for PT Re-Evaluation  12/21/18    Authorization Type  aetna MCR    PT Start Time  0715    PT Stop Time  0800    PT Time Calculation (min)  45 min       Past Medical History:  Diagnosis Date  . Hyperlipidemia   . Hypertension     History reviewed. No pertinent surgical history.  There were no vitals filed for this visit.  Subjective Assessment - 12/05/18 0719    Subjective  Per son, compliance with HEP.    Currently in Pain?  No/denies         [facility] PT Assessment - 12/05/18 0001      Strength   Strength Assessment Site  Hip    Right/Left Hip  Right;Left    Right Hip Internal Rotation  3/5    Right Hip ABduction  3-/5    Left Hip ABduction  5/5                   [facility] Adult PT Treatment/Exercise - 12/05/18 0001      Ambulation/Gait   Pre-Gait Activities  in parallel bars with mirror feedback , using left hand to mimic SPC on bars, forward gait focusing on level pelvis    Gait Comments  gait with SPC in left hand ,and right hand on right lateral hip for feedback, some improvement in gait with cues       Knee/Hip Exercises: Aerobic   Nustep  Nustep x 5 minutes L4       Knee/Hip Exercises: Standing   Hip Flexion Limitations  multiple trials in parallel bars with LUE support , cues to keep hips level     Abduction Limitations  multiple trials in parallel bars with LUE support , cues to keep hips level     Other Standing Knee Exercises  lateral weight shifting focusing on level pelvis       Knee/Hip Exercises: Seated   Other Seated Knee/Hip  Exercises  Seated ball squeeze with bilateral hip IR  and right only hip IR AROM, aded yellow band but unable to tolerate resistance       Knee/Hip Exercises: Sidelying   Hip ABduction  10 reps    Hip ABduction Limitations  needs manual assist from PTA to achieve full ROM     Clams  unable to perform reverse clam              PT Education - 12/05/18 0846    Education Details  HEP    Person(s) Educated  Patient    Methods  Explanation;Handout    Comprehension  Verbalized understanding       PT Short Term Goals - 12/05/18 0720      PT SHORT TERM GOAL #1   Title  Son, or other family member, will verbalize pt ability to perform exercises with assistance    Baseline  independent with HEP    Time  3    Period  Weeks    Status  Achieved  PT Long Term Goals - 11/08/18 1509      PT LONG TERM GOAL #1   Title  Able to demo SLS with level pelvs    Baseline  trendelenburg in Rt stance    Time  6    Period  Weeks    Status  New    Target Date  12/21/18      PT LONG TERM GOAL #2   Title  Pt will be able to walk for at least 30 minutes comfortably for exercise    Baseline  son reports significant fatigue noted after 20 min    Time  6    Period  Weeks    Status  New    Target Date  12/21/18      PT LONG TERM GOAL #3   Title  TUG <15s to indicate reduced risk for fall    Baseline  to be measured at next visit    Time  6    Period  Weeks    Status  New    Target Date  12/21/18            Plan - 12/05/18 8657    Clinical Impression Statement  Son reports compliance with HEP and Pt is able to perform exercises with his cues. STG# 1 met. Son reports improvement in hip drop only with max continuous cues. Trial of SPC and right hand on lateral hip to prevent right lateral hip sway. She does better with the [facility] Encouraged use at home/ community. Rechecked hip strength which revealed right hip weakness in abductors and rotators. Updated HEP to target hip abduction  and IR.    PT Next Visit Plan  gross stretching that she can do independently, side stepping, nu step, single leg bridge,continue to asses right hip strength    PT Home Exercise Plan  sit<>stand, lateral weight shift, standing hip ext & abd, heel raises, tandem stance, bridge , green band clam supine, seated IR AROM with ball between knees, S/L hip abduction with assist from son.       Patient will benefit from skilled therapeutic intervention in order to improve the following deficits and impairments:  Abnormal gait, Difficulty walking, Decreased activity tolerance, Pain, Decreased balance, Improper body mechanics, Postural dysfunction  Visit Diagnosis: Other abnormalities of gait and mobility  Difficulty in walking, not elsewhere classified     Problem List Patient Active Problem List   Diagnosis Date Noted  . Dementia ([place]) 10/24/2018    Dorene Ar, PTA 12/05/2018, 9:22 AM  [facility] [address] [place], [place], [address] Phone: [phone]   Fax:  [phone]  Name: Sheri Miller MRN: [id number] Date of Birth: 02-26-1924

## 2018-12-07 ENCOUNTER — Other Ambulatory Visit: Payer: Self-pay

## 2018-12-07 ENCOUNTER — Encounter: Payer: Self-pay | Admitting: Physical Therapy

## 2018-12-07 ENCOUNTER — Ambulatory Visit: Payer: Medicare HMO | Admitting: Physical Therapy

## 2018-12-07 DIAGNOSIS — R2689 Other abnormalities of gait and mobility: Secondary | ICD-10-CM

## 2018-12-07 DIAGNOSIS — R262 Difficulty in walking, not elsewhere classified: Secondary | ICD-10-CM | POA: Diagnosis not present

## 2018-12-07 NOTE — Therapy (Signed)
[place], [place], [address] Phone: [phone]   Fax:  [phone]  Physical Therapy Treatment  Patient Details  Name: Sheri Miller MRN: [id number] Date of Birth: 04-25-1923 Referring Provider (PT): Wendie Agreste, MD   Encounter Date: 12/07/2018  PT End of Session - 12/07/18 0726    Visit Number  5    Number of Visits  13    Date for PT Re-Evaluation  12/21/18    Authorization Type  aetna MCR    PT Start Time  0720    PT Stop Time  0755    PT Time Calculation (min)  35 min    Activity Tolerance  Patient tolerated treatment well    Behavior During Therapy  [facility] for tasks assessed/performed       Past Medical History:  Diagnosis Date  . Hyperlipidemia   . Hypertension     History reviewed. No pertinent surgical history.  There were no vitals filed for this visit.  Subjective Assessment - 12/07/18 0724    Subjective  Pt reports "I am feeling good"    [place] [address] about a week ago and had bruised eyes and a bloody nose and a softball-sized welt on her Left hip.    Currently in Pain?  No/denies                       [facility] Adult PT Treatment/Exercise - 12/07/18 0001      Knee/Hip Exercises: Standing   Hip Flexion Limitations  with Rt arm OH    Abduction Limitations  standing behind chair, beginning with LE turnout bilaterally    Other Standing Knee Exercises  hip hing with glut set to stand      Knee/Hip Exercises: Seated   Long Arc Quad Limitations  with ball bw knees'    Other Seated Knee/Hip Exercises  Seated ball squeeze with bilateral hip IR  and right only hip IR AROM    Other Seated Knee/Hip Exercises  bil LE LAQ ball be feet for IR      Knee/Hip Exercises: Supine   Single Leg Bridge  15 reps   tactile cues for guiding by PT              PT Short Term Goals - 12/05/18 0720      PT SHORT TERM GOAL #1   Title  Son, or other family member, will verbalize pt  ability to perform exercises with assistance    Baseline  independent with HEP    Time  3    Period  Weeks    Status  Achieved        PT Long Term Goals - 11/08/18 1509      PT LONG TERM GOAL #1   Title  Able to demo SLS with level pelvs    Baseline  trendelenburg in Rt stance    Time  6    Period  Weeks    Status  New    Target Date  12/21/18      PT LONG TERM GOAL #2   Title  Pt will be able to walk for at least 30 minutes comfortably for exercise    Baseline  son reports significant fatigue noted after 20 min    Time  6    Period  Weeks    Status  New    Target Date  12/21/18      PT LONG TERM GOAL #3  Title  TUG <15s to indicate reduced risk for fall    Baseline  to be measured at next visit    Time  6    Period  Weeks    Status  New    Target Date  12/21/18            Plan - 12/07/18 0756    Clinical Impression Statement  Modified standing exercises for HEP to add Rt arm OH with Lt march and standing hip abductions with feet turned out. Tactile cues for single leg bridge alignment required.    PT Treatment/Interventions  ADLs/Self Care Home Management;Cryotherapy;Electrical Stimulation;Moist Heat;Traction;Balance training;Therapeutic exercise;Therapeutic activities;Functional mobility training;Stair training;Gait training;Neuromuscular re-education;Patient/family education;Dry needling;Passive range of motion;Manual techniques    PT Home Exercise Plan  sit<>stand, lateral weight shift, standing hip ext & abd (LE turned out), heel raises, tandem stance, bridge , green band clam supine, seated IR AROM with ball between knees, S/L hip abduction with assist from son. Lt march with Rt UE OH    Consulted and Agree with Plan of Care  Patient;Family member/caregiver    Family Member Consulted  Son       Patient will benefit from skilled therapeutic intervention in order to improve the following deficits and impairments:  Abnormal gait, Difficulty walking, Decreased  activity tolerance, Pain, Decreased balance, Improper body mechanics, Postural dysfunction  Visit Diagnosis: Other abnormalities of gait and mobility  Difficulty in walking, not elsewhere classified     Problem List Patient Active Problem List   Diagnosis Date Noted  . Dementia ([place]) 10/24/2018   Detravion Tester C. Jakaria Lavergne PT, DPT 12/07/18 7:59 AM   [facility] [address] [place], [place], [address] Phone: [phone]   Fax:  [phone]  Name: Sheri Miller MRN: [id number] Date of Birth: 1923-12-13

## 2018-12-10 ENCOUNTER — Ambulatory Visit: Payer: Medicare HMO | Admitting: Physical Therapy

## 2018-12-10 ENCOUNTER — Encounter: Payer: Self-pay | Admitting: Family Medicine

## 2018-12-10 ENCOUNTER — Other Ambulatory Visit: Payer: Self-pay

## 2018-12-10 ENCOUNTER — Telehealth (INDEPENDENT_AMBULATORY_CARE_PROVIDER_SITE_OTHER): Payer: Medicare HMO | Admitting: Family Medicine

## 2018-12-10 DIAGNOSIS — E039 Hypothyroidism, unspecified: Secondary | ICD-10-CM | POA: Diagnosis not present

## 2018-12-10 DIAGNOSIS — R269 Unspecified abnormalities of gait and mobility: Secondary | ICD-10-CM | POA: Diagnosis not present

## 2018-12-10 DIAGNOSIS — Y92009 Unspecified place in unspecified non-institutional (private) residence as the place of occurrence of the external cause: Secondary | ICD-10-CM | POA: Diagnosis not present

## 2018-12-10 DIAGNOSIS — W19XXXA Unspecified fall, initial encounter: Secondary | ICD-10-CM | POA: Diagnosis not present

## 2018-12-10 DIAGNOSIS — S7002XA Contusion of left hip, initial encounter: Secondary | ICD-10-CM

## 2018-12-10 NOTE — Patient Instructions (Addendum)
  Lab only visit this week for thyroid test, then can decide on medication changes if needed.    Continue physical therapy, then follow-up in 6 weeks to decide if orthopedic evaluation needed.  Let me know if you would like to meet with orthopedics sooner.  As long as there is no pain with walking or weight on left leg okay to continue to watch for swelling improvement from recent fall.  If at any point there is pain with placing weight on the leg or worsening symptoms, please return to office for evaluation and likely x-rays.  If left shoulder symptoms increase in frequency or worsen, be seen right away so I can evaluate that area further and possible x-rays.  Let me know if there are questions.   If you have lab work done today you will be contacted with your lab results within the next 2 weeks.  If you have not heard from [place] then please contact us. The fastest way to get your results is to register for My Chart.   IF you received an x-ray today, you will receive an invoice from [facility]. Please contact [facility] at [phone] with questions or concerns regarding your invoice.   IF you received labwork today, you will receive an invoice from [facility]. Please contact [facility] at [phone] with questions or concerns regarding your invoice.   Our billing staff will not be able to assist you with questions regarding bills from these companies.  You will be contacted with the lab results as soon as they are available. The fastest way to get your results is to activate your My Chart account. Instructions are located on the last page of this paperwork. If you have not heard from [place] regarding the results in 2 weeks, please contact this office.

## 2018-12-10 NOTE — Progress Notes (Signed)
Virtual Visit via Telephone Note  I connected with [name] on 12/10/18 at 6:06 PM by telephone and verified that I am speaking with the correct person using two identifiers.   I discussed the limitations, risks, security and privacy concerns of performing an evaluation and management service by telephone and the availability of in person appointments. I also discussed with the patient that there may be a patient responsible charge related to this service. The patient expressed understanding and agreed to proceed, consent obtained.   Chief complaint:  Gait abnormality/DDD, fall, follow-up conditions.  History of Present Illness: Sheri Miller is a 85 y.o. female  History provided by son - history of dementia.    Gait abnormality  -Discussed July 20.  Degenerative disc disease with degenerative grade 1 spondylolisthesis noted on x-Miller.  Possible Trendelenburg gait from abductor weakness without known injury.  Option of orthopedic eval discussed versus trial of physical therapy, and wanted to try PT first. Appears to have had 5 therapy sessions.  Most recently 8/28.  Son feels like exercises going well, but has not seen any change in walking. Can walk better if she tries to put her hand on hip and focus on those muscles.   Some pain in left shoulder- pain present for years - on and off pain. No new injury.  Notes with particular exercise, or every few weeks.  Hypothyroidism: Lab Results  Component Value Date   TSH 5.140 (H) 10/29/2018  Borderline TSH at 5.14 in July 20, lower than 5.93 in January.  Free T4 had decreased from 1.24-0.80.  Based on her age we continue same dose of medication but with plan to follow-up thyroid testing in 6 weeks.   Fall at home: Occurred on 8/20.  Son thinks fell out of bed - bloody nose (Pressure to nose stopped bleeding) and black eye, minor, but that resolved after a week. No c/o face pain. Large bruise on left hip, able to put weight on leg, and  did not change her gait.   Some initial R arm pain, but that improved after 1 day.  Still some soreness in left hip. Sore to put pressure on swollen area, but not complaining of pain with pressure on leg.  Swollen area improving - about the size of a golf ball.   Has been able to lift leg with exercises, and no complaints with doing these exercises.              Patient Active Problem List   Diagnosis Date Noted  . Dementia ([place]) 10/24/2018   Past Medical History:  Diagnosis Date  . Hyperlipidemia   . Hypertension    No past surgical history on file. No Known Allergies Prior to Admission medications   Medication Sig Start Date End Date Taking? Authorizing Provider  escitalopram (LEXAPRO) 10 MG tablet Take 1 tablet (10 mg total) by mouth at bedtime. 10/29/18  Yes Wendie Agreste, MD  levothyroxine (SYNTHROID) 50 MCG tablet TAKE 1 TABLET(50 MCG) BY MOUTH DAILY 10/29/18  Yes Wendie Agreste, MD  lisinopril (ZESTRIL) 20 MG tablet TAKE 1 TABLET BY MOUTH EVERY DAY 10/29/18  Yes Wendie Agreste, MD   Social History   Socioeconomic History  . Marital status: Single    Spouse name: Not on file  . Number of children: 4  . Years of education: Not on file  . Highest education level: Not on file  Occupational History  . Not on file  Social Needs  .  Financial resource strain: Not on file  . Food insecurity    Worry: Not on file    Inability: Not on file  . Transportation needs    Medical: Not on file    Non-medical: Not on file  Tobacco Use  . Smoking status: Never Smoker  . Smokeless tobacco: Never Used  Substance and Sexual Activity  . Alcohol use: Never    Frequency: Never  . Drug use: Never  . Sexual activity: Not on file  Lifestyle  . Physical activity    Days per week: Not on file    Minutes per session: Not on file  . Stress: Not on file  Relationships  . Social Herbalist on phone: Not on file    Gets together: Not on file    Attends  religious service: Not on file    Active member of club or organization: Not on file    Attends meetings of clubs or organizations: Not on file    Relationship status: Not on file  . Intimate partner violence    Fear of current or ex partner: Not on file    Emotionally abused: Not on file    Physically abused: Not on file    Forced sexual activity: Not on file  Other Topics Concern  . Not on file  Social History Narrative   Pt lives with her son and his family (wife and 2 sons) in 2 story home   Has 4 children   8th grade education   Retired Regulatory affairs officer      Observations/Objective: Patient without any concerns/complaints.  History provided from son as above.  There were no vitals filed for this visit.   Assessment and Plan: Hypothyroidism, unspecified type - Plan: TSH + free T4  -Borderline elevated TSH with borderline low free T4 on testing in July.  Plan for repeat testing this week.  Would be cautious with med increases given her age.  Fall in home, initial encounter Contusion of left hip, initial encounter  -Denies pain with weightbearing, is able to do exercises without apparent discomfort per son.  Bruising is improving.  Option and in office evaluation and x-rays discussed but declined at this time.  RTC precautions given.  Has adjusted location of bed to minimize risk of falling.  Abnormality of gait  -Continue physical therapy, but discussed potential need for orthopedic evaluation if not improving as degenerative disc disease may be the cause.  RTC precautions if worsening symptoms.  Follow Up Instructions: Lab visit this week, 6-week follow-up in person   I discussed the assessment and treatment plan with the patient. The patient was provided an opportunity to ask questions and all were answered. The patient agreed with the plan and demonstrated an understanding of the instructions.   The patient was advised to call back or seek an in-person evaluation if the  symptoms worsen or if the condition fails to improve as anticipated.  I provided 25 minutes of non-face-to-face time during this encounter.  Signed,   Sheri Ray, MD [facility].  12/10/18

## 2018-12-10 NOTE — Progress Notes (Signed)
She has fallen 11/29/18 and was a bit bloody and bruised her left hip. She fell out bed.   6 Week f/u medical condition   Has been on PT 3 weeks

## 2018-12-12 ENCOUNTER — Ambulatory Visit: Payer: Medicare HMO | Attending: Family Medicine | Admitting: Physical Therapy

## 2018-12-12 ENCOUNTER — Encounter: Payer: Self-pay | Admitting: Physical Therapy

## 2018-12-12 ENCOUNTER — Other Ambulatory Visit: Payer: Self-pay

## 2018-12-12 DIAGNOSIS — R2689 Other abnormalities of gait and mobility: Secondary | ICD-10-CM | POA: Diagnosis not present

## 2018-12-12 DIAGNOSIS — R262 Difficulty in walking, not elsewhere classified: Secondary | ICD-10-CM | POA: Diagnosis not present

## 2018-12-12 DIAGNOSIS — H5213 Myopia, bilateral: Secondary | ICD-10-CM | POA: Diagnosis not present

## 2018-12-12 NOTE — Therapy (Signed)
[place], [place], [address] Phone: [phone]   Fax:  [phone]  Physical Therapy Treatment  Patient Details  Name: Sheri Miller MRN: [id number] Date of Birth: 06-04-1923 Referring Provider (PT): Wendie Agreste, MD   Encounter Date: 12/12/2018  PT End of Session - 12/12/18 0722    Visit Number  6    Number of Visits  13    Date for PT Re-Evaluation  12/21/18    Authorization Type  aetna MCR    PT Start Time  0720    PT Stop Time  0758    PT Time Calculation (min)  38 min       Past Medical History:  Diagnosis Date  . Hyperlipidemia   . Hypertension     History reviewed. No pertinent surgical history.  There were no vitals filed for this visit.  Subjective Assessment - 12/12/18 0721    Subjective  Saw MD yesterday and he said continue PT, then F/U if no improvement.    Currently in Pain?  No/denies                       [facility] Adult PT Treatment/Exercise - 12/12/18 0001      Knee/Hip Exercises: Aerobic   Nustep  Nustep x 5 minutes L5       Knee/Hip Exercises: Standing   Heel Raises  10 reps    Hip Flexion Limitations  with Rt arm OH, needs tactile cues     Abduction Limitations  standing behind chair, beginning with LE turnout bilaterally    Hip Extension  20 reps    Other Standing Knee Exercises  hip hing with glut set to stand 10 x 2     Other Standing Knee Exercises  tandem stance each way       Knee/Hip Exercises: Seated   Other Seated Knee/Hip Exercises  Seated ball squeeze with bilateral hip IR  and right only hip IR AROM, also with manual resist from PTA      Knee/Hip Exercises: Supine   Single Leg Bridge  20 reps   tactile cues for guiding by PT     Knee/Hip Exercises: Sidelying   Hip ABduction  20 reps    Hip ABduction Limitations  needs manual assist from PTA to achieve full ROM     Clams  reverse clam with manual assist from PTA , clam AROM and with  red band                PT Short Term Goals - 12/05/18 0720      PT SHORT TERM GOAL #1   Title  Son, or other family member, will verbalize pt ability to perform exercises with assistance    Baseline  independent with HEP    Time  3    Period  Weeks    Status  Achieved        PT Long Term Goals - 11/08/18 1509      PT LONG TERM GOAL #1   Title  Able to demo SLS with level pelvs    Baseline  trendelenburg in Rt stance    Time  6    Period  Weeks    Status  New    Target Date  12/21/18      PT LONG TERM GOAL #2   Title  Pt will be able to walk for at least 30 minutes comfortably for exercise  Baseline  son reports significant fatigue noted after 20 min    Time  6    Period  Weeks    Status  New    Target Date  12/21/18      PT LONG TERM GOAL #3   Title  TUG <15s to indicate reduced risk for fall    Baseline  to be measured at next visit    Time  6    Period  Weeks    Status  New    Target Date  12/21/18            Plan - 12/12/18 0806    Clinical Impression Statement  Slight improvement in ability to lift into abduction and side lying hip IR without assist. Saw MD Monday who recommended continued PT and possible referal if no change.    PT Next Visit Plan  gross stretching that she can do independently, side stepping, nu step, single leg bridge,continue to asses right hip strength       Patient will benefit from skilled therapeutic intervention in order to improve the following deficits and impairments:  Abnormal gait, Difficulty walking, Decreased activity tolerance, Pain, Decreased balance, Improper body mechanics, Postural dysfunction  Visit Diagnosis: Other abnormalities of gait and mobility  Difficulty in walking, not elsewhere classified     Problem List Patient Active Problem List   Diagnosis Date Noted  . Dementia ([place]) 10/24/2018    Dorene Ar, PTA 12/12/2018, 8:08 AM  [facility] [address] [place], [place], [address] Phone: [phone]   Fax:  [phone]  Name: Sheri Miller MRN: [id number] Date of Birth: 08-23-1923

## 2018-12-19 ENCOUNTER — Ambulatory Visit: Payer: Medicare HMO | Admitting: Physical Therapy

## 2018-12-19 ENCOUNTER — Encounter: Payer: Self-pay | Admitting: Physical Therapy

## 2018-12-19 ENCOUNTER — Other Ambulatory Visit: Payer: Self-pay

## 2018-12-19 DIAGNOSIS — R2689 Other abnormalities of gait and mobility: Secondary | ICD-10-CM

## 2018-12-19 DIAGNOSIS — R262 Difficulty in walking, not elsewhere classified: Secondary | ICD-10-CM | POA: Diagnosis not present

## 2018-12-19 NOTE — Therapy (Signed)
[place], [place], [address] Phone: [phone]   Fax:  [phone]  Physical Therapy Treatment  Patient Details  Name: Sheri Miller MRN: [id number] Date of Birth: Sep 26, 1923 Referring Provider (PT): Wendie Agreste, MD   Encounter Date: 12/19/2018  PT End of Session - 12/19/18 0720    Visit Number  7    Number of Visits  13    Date for PT Re-Evaluation  12/21/18    Authorization Type  aetna MCR    PT Start Time  [id number]    PT Stop Time  0755    PT Time Calculation (min)  39 min       Past Medical History:  Diagnosis Date  . Hyperlipidemia   . Hypertension     History reviewed. No pertinent surgical history.  There were no vitals filed for this visit.  Subjective Assessment - 12/19/18 0719    Subjective  Per son, patient is about the same. He reports patient fatigues quickly with walking [place].    Currently in Pain?  No/denies                       [facility] Adult PT Treatment/Exercise - 12/19/18 0001      Knee/Hip Exercises: Aerobic   Nustep  Nustep x 5 minutes L5       Knee/Hip Exercises: Standing   Heel Raises  20 reps    Hip Extension  20 reps    Other Standing Knee Exercises  tandem stance each way    60 sec best      Knee/Hip Exercises: Seated   Other Seated Knee/Hip Exercises  seated ball squeeze with AROM IR, then with yellow band resist       Knee/Hip Exercises: Supine   Single Leg Bridge  20 reps   tactile cues for guiding by PT     Knee/Hip Exercises: Sidelying   Hip ABduction  20 reps    Hip ABduction Limitations  needs manual assist from PTA to achieve full ROM     Clams  reverse clam with manual assist from PTA , clam AROM and with red band                PT Short Term Goals - 12/05/18 0720      PT SHORT TERM GOAL #1   Title  Son, or other family member, will verbalize pt ability to perform exercises with assistance    Baseline   independent with HEP    Time  3    Period  Weeks    Status  Achieved        PT Long Term Goals - 11/08/18 1509      PT LONG TERM GOAL #1   Title  Able to demo SLS with level pelvs    Baseline  trendelenburg in Rt stance    Time  6    Period  Weeks    Status  New    Target Date  12/21/18      PT LONG TERM GOAL #2   Title  Pt will be able to walk for at least 30 minutes comfortably for exercise    Baseline  son reports significant fatigue noted after 20 min    Time  6    Period  Weeks    Status  New    Target Date  12/21/18      PT LONG  TERM GOAL #3   Title  TUG <15s to indicate reduced risk for fall    Baseline  to be measured at next visit    Time  6    Period  Weeks    Status  New    Target Date  12/21/18            Plan - 12/19/18 0759    Clinical Impression Statement  She continues with trendelenburg gait pattern and weakness evident in right hip. Tandem stance improved to 60 seconds without UE support.    PT Next Visit Plan  gross stretching that she can do independently, side stepping, nu step, single leg bridge,continue to asses right hip strength    Consulted and Agree with Plan of Care  Patient;Family member/caregiver    Family Member Consulted  Son       Patient will benefit from skilled therapeutic intervention in order to improve the following deficits and impairments:  Abnormal gait, Difficulty walking, Decreased activity tolerance, Pain, Decreased balance, Improper body mechanics, Postural dysfunction  Visit Diagnosis: Other abnormalities of gait and mobility  Difficulty in walking, not elsewhere classified     Problem List Patient Active Problem List   Diagnosis Date Noted  . Dementia ([place]) 10/24/2018    Dorene Ar, PTA 12/19/2018, 8:05 AM  [facility] [address] [place], [place], [address] Phone: [phone]   Fax:  [phone]  Name: Sheri Miller MRN: [id number] Date  of Birth: Jan 01, 1924

## 2018-12-21 ENCOUNTER — Other Ambulatory Visit: Payer: Self-pay

## 2018-12-21 ENCOUNTER — Encounter: Payer: Self-pay | Admitting: Physical Therapy

## 2018-12-21 ENCOUNTER — Ambulatory Visit: Payer: Medicare HMO | Admitting: Physical Therapy

## 2018-12-21 DIAGNOSIS — R262 Difficulty in walking, not elsewhere classified: Secondary | ICD-10-CM | POA: Diagnosis not present

## 2018-12-21 DIAGNOSIS — R2689 Other abnormalities of gait and mobility: Secondary | ICD-10-CM

## 2018-12-21 NOTE — Therapy (Signed)
[place], [place], [address] Phone: [phone]   Fax:  [phone]  Physical Therapy Treatment/Discharge  Patient Details  Name: Sheri Miller MRN: [id number] Date of Birth: 1923-12-13 Referring Provider (PT): Wendie Agreste, MD   Encounter Date: 12/21/2018  PT End of Session - 12/21/18 0721    Visit Number  8    Date for PT Re-Evaluation  12/21/18    Authorization Type  aetna MCR    PT Start Time  0716    PT Stop Time  0750    PT Time Calculation (min)  34 min    Activity Tolerance  Patient tolerated treatment well    Behavior During Therapy  [facility] for tasks assessed/performed       Past Medical History:  Diagnosis Date  . Hyperlipidemia   . Hypertension     History reviewed. No pertinent surgical history.  There were no vitals filed for this visit.  Subjective Assessment - 12/21/18 0801    Subjective  doing exercises at home every day    Currently in Pain?  No/denies         [facility] PT Assessment - 12/21/18 0001      Assessment   Medical Diagnosis  abnormality of gait    Referring Provider (PT)  Wendie Agreste, MD      Strength   Right Hip External Rotation   5/5    Right Hip Internal Rotation  5/5    Right Hip ABduction  3+/5                   [facility] Adult PT Treatment/Exercise - 12/21/18 0001      Knee/Hip Exercises: Standing   Heel Raises  20 reps    Hip Flexion Limitations  opp arm/leg march    Other Standing Knee Exercises  tandem without UE support- tactile cues for alignment      Knee/Hip Exercises: Seated   Other Seated Knee/Hip Exercises  seated ball squeeze with reverse clam & LAQ      Knee/Hip Exercises: Sidelying   Hip ABduction  15 reps    Hip ABduction Limitations  manual & verbal cues    Clams  reverse clam               PT Short Term Goals - 12/05/18 0720      PT SHORT TERM GOAL #1   Title  Son, or other family member, will  verbalize pt ability to perform exercises with assistance    Baseline  independent with HEP    Time  3    Period  Weeks    Status  Achieved        PT Long Term Goals - 12/21/18 2841      PT LONG TERM GOAL #1   Title  Able to demo SLS with level pelvs    Baseline  Mild unlevel pelvis    Status  Not Met      PT LONG TERM GOAL #2   Title  Pt will be able to walk for at least 30 minutes comfortably for exercise    Baseline  doing walks around the cul[place]de[place]sac, about 8 times that takes about 20 min, just started a couple of days ago, just needs reminders for hand on hip    Status  Partially Met      PT LONG TERM GOAL #3   Title  TUG <15s to indicate reduced risk for fall  Baseline  12s    Status  Achieved            Plan - 12/21/18 0753    Clinical Impression Statement  Pt has made significant improvements in strength and MMT but still requires very heavy cuing for form. Inconsistent with performance of hip abductor activation, especially with OKC to CKC. Significant improvement in trendelenburg with placement of hand on hip but forgets and removes her hand. Son is doing very well working with her and reminds her with proper cues frequently so we feel it is appropriate to d/c at this time.    PT Treatment/Interventions  ADLs/Self Care Home Management;Cryotherapy;Electrical Stimulation;Moist Heat;Traction;Balance training;Therapeutic exercise;Therapeutic activities;Functional mobility training;Stair training;Gait training;Neuromuscular re-education;Patient/family education;Dry needling;Passive range of motion;Manual techniques    PT Home Exercise Plan  sit<>stand, lateral weight shift, standing hip ext & abd (LE turned out), heel raises, tandem stance, bridge , green band clam supine, seated IR AROM with ball between knees, S/L hip abduction with assist from son. Lt march with Rt UE OH    Consulted and Agree with Plan of Care  Patient;Family member/caregiver    Family Member Consulted   Son       Patient will benefit from skilled therapeutic intervention in order to improve the following deficits and impairments:  Abnormal gait, Difficulty walking, Decreased activity tolerance, Pain, Decreased balance, Improper body mechanics, Postural dysfunction  Visit Diagnosis: Other abnormalities of gait and mobility  Difficulty in walking, not elsewhere classified     Problem List Patient Active Problem List   Diagnosis Date Noted  . Dementia ([place]) 10/24/2018   PHYSICAL THERAPY DISCHARGE SUMMARY  Visits from Start of Care: 8  Current functional level related to goals / functional outcomes: See above   Remaining deficits: See above   Education / Equipment: Anatomy of condition, POC, HEP, exercise form/rationale  Plan: Patient agrees to discharge.  Patient goals were partially met. Patient is being discharged due to lack of progress.  ?????    Appropriate for independent HEP.   Aracelys Glade C. Lonnie Reth PT, DPT 12/21/18 8:07 AM   [place], [place], [address] Phone: [phone]   Fax:  [phone]  Name: Sheri Miller MRN: [id number] Date of Birth: 11-Jun-1923

## 2018-12-25 ENCOUNTER — Ambulatory Visit: Payer: Medicare HMO | Admitting: Physical Therapy

## 2018-12-27 ENCOUNTER — Ambulatory Visit: Payer: Medicare HMO | Admitting: Physical Therapy

## 2019-01-01 ENCOUNTER — Ambulatory Visit: Payer: Medicare HMO | Admitting: Physical Therapy

## 2019-01-03 ENCOUNTER — Ambulatory Visit: Payer: Medicare HMO | Admitting: Physical Therapy

## 2019-01-17 DIAGNOSIS — M2042 Other hammer toe(s) (acquired), left foot: Secondary | ICD-10-CM | POA: Diagnosis not present

## 2019-01-17 DIAGNOSIS — M2041 Other hammer toe(s) (acquired), right foot: Secondary | ICD-10-CM | POA: Diagnosis not present

## 2019-01-17 DIAGNOSIS — L602 Onychogryphosis: Secondary | ICD-10-CM | POA: Diagnosis not present

## 2019-01-17 DIAGNOSIS — L03031 Cellulitis of right toe: Secondary | ICD-10-CM | POA: Diagnosis not present

## 2019-01-17 DIAGNOSIS — I739 Peripheral vascular disease, unspecified: Secondary | ICD-10-CM | POA: Diagnosis not present

## 2019-01-17 DIAGNOSIS — L03032 Cellulitis of left toe: Secondary | ICD-10-CM | POA: Diagnosis not present

## 2019-01-18 ENCOUNTER — Telehealth: Payer: Self-pay | Admitting: Family Medicine

## 2019-01-18 NOTE — Telephone Encounter (Signed)
I called pt's son [name] and left a vm that pt's paperwork has been completed. I have placed a copy for pick up file folder.

## 2019-03-08 ENCOUNTER — Emergency Department (HOSPITAL_COMMUNITY)
Admission: EM | Admit: 2019-03-08 | Discharge: 2019-03-08 | Disposition: A | Discharge status: Home / Self Care | Payer: Medicare HMO | Attending: Emergency Medicine | Admitting: Emergency Medicine

## 2019-03-08 ENCOUNTER — Emergency Department (HOSPITAL_COMMUNITY): Payer: Medicare HMO

## 2019-03-08 ENCOUNTER — Other Ambulatory Visit: Payer: Self-pay

## 2019-03-08 DIAGNOSIS — S0993XA Unspecified injury of face, initial encounter: Secondary | ICD-10-CM | POA: Diagnosis not present

## 2019-03-08 DIAGNOSIS — Y92512 Supermarket, store or market as the place of occurrence of the external cause: Secondary | ICD-10-CM | POA: Diagnosis not present

## 2019-03-08 DIAGNOSIS — S01111A Laceration without foreign body of right eyelid and periocular area, initial encounter: Secondary | ICD-10-CM | POA: Insufficient documentation

## 2019-03-08 DIAGNOSIS — S01411A Laceration without foreign body of right cheek and temporomandibular area, initial encounter: Secondary | ICD-10-CM | POA: Diagnosis not present

## 2019-03-08 DIAGNOSIS — Y939 Activity, unspecified: Secondary | ICD-10-CM | POA: Diagnosis not present

## 2019-03-08 DIAGNOSIS — W109XXA Fall (on) (from) unspecified stairs and steps, initial encounter: Secondary | ICD-10-CM | POA: Insufficient documentation

## 2019-03-08 DIAGNOSIS — I10 Essential (primary) hypertension: Secondary | ICD-10-CM | POA: Insufficient documentation

## 2019-03-08 DIAGNOSIS — W19XXXA Unspecified fall, initial encounter: Secondary | ICD-10-CM

## 2019-03-08 DIAGNOSIS — Z79899 Other long term (current) drug therapy: Secondary | ICD-10-CM | POA: Insufficient documentation

## 2019-03-08 DIAGNOSIS — S0083XA Contusion of other part of head, initial encounter: Secondary | ICD-10-CM

## 2019-03-08 DIAGNOSIS — M25531 Pain in right wrist: Secondary | ICD-10-CM

## 2019-03-08 DIAGNOSIS — S0181XA Laceration without foreign body of other part of head, initial encounter: Secondary | ICD-10-CM

## 2019-03-08 DIAGNOSIS — F0390 Unspecified dementia without behavioral disturbance: Secondary | ICD-10-CM | POA: Diagnosis not present

## 2019-03-08 DIAGNOSIS — S199XXA Unspecified injury of neck, initial encounter: Secondary | ICD-10-CM | POA: Diagnosis not present

## 2019-03-08 DIAGNOSIS — Y999 Unspecified external cause status: Secondary | ICD-10-CM | POA: Insufficient documentation

## 2019-03-08 DIAGNOSIS — S0990XA Unspecified injury of head, initial encounter: Secondary | ICD-10-CM | POA: Diagnosis not present

## 2019-03-08 DIAGNOSIS — M542 Cervicalgia: Secondary | ICD-10-CM | POA: Diagnosis not present

## 2019-03-08 DIAGNOSIS — R69 Illness, unspecified: Secondary | ICD-10-CM | POA: Diagnosis not present

## 2019-03-08 DIAGNOSIS — M25539 Pain in unspecified wrist: Secondary | ICD-10-CM | POA: Diagnosis not present

## 2019-03-08 MED ORDER — LIDOCAINE HCL (PF) 1 % IJ SOLN
10.0000 mL | Freq: Once | INTRAMUSCULAR | Status: DC
  Filled 2019-03-08: qty 10

## 2019-03-08 NOTE — ED Notes (Signed)
Pt. Stated that she had to urinate. Due to fall,offered to put her on bed-pan or possibly a bed side. Pt. Stated she was fine, could hold it and refus d further assitannce

## 2019-03-08 NOTE — ED Triage Notes (Signed)
Pt had a fall down 2-3 steps and hit right side of head on a chair. Pt has laceration to right side of head and swelling and pain to left wrist. Pt has dementia at baseline arrives with son.

## 2019-03-08 NOTE — ED Provider Notes (Addendum)
[facility] EMERGENCY DEPARTMENT Provider Note   CSN: [id number] Arrival date & time: 03/08/19  1817     History   Chief Complaint Chief Complaint  Patient presents with   Fall    HPI Sheri Miller is a 85 y.o. female with history of hypertension, hyperlipidemia, dementia who presents with facial swelling and laceration and right wrist pain after trip and fall.  Patient's son reports that they were going to the grocery store and patient was walking ahead of him and she tripped down the step and hit her head on a metal chair that was next to the step.  She did not lose consciousness.  She is having right wrist pain after falling on outstretched hand.  Tetanus is up-to-date.  Patient does not have any memory of being event as she has dementia and is acting her self.  They were sent from urgent care for imaging.  She is not anticoagulated.     HPI  Past Medical History:  Diagnosis Date   Hyperlipidemia    Hypertension     Patient Active Problem List   Diagnosis Date Noted   Dementia ([place]) 10/24/2018    No past surgical history on file.   OB History   No obstetric history on file.      Home Medications    Prior to Admission medications   Medication Sig Start Date End Date Taking? Authorizing Provider  escitalopram (LEXAPRO) 10 MG tablet Take 1 tablet (10 mg total) by mouth at bedtime. 10/29/18   Wendie Agreste, MD  levothyroxine (SYNTHROID) 50 MCG tablet TAKE 1 TABLET(50 MCG) BY MOUTH DAILY 10/29/18   Wendie Agreste, MD  lisinopril (ZESTRIL) 20 MG tablet TAKE 1 TABLET BY MOUTH EVERY DAY 10/29/18   Wendie Agreste, MD    Family History Family History  Problem Relation Age of Onset   Dementia Brother     Social History Social History   Tobacco Use   Smoking status: Never Smoker   Smokeless tobacco: Never Used  Substance Use Topics   Alcohol use: Never    Frequency: Never   Drug use: Never     Allergies   Patient has no  known allergies.   Review of Systems Review of Systems  Unable to perform ROS: Dementia  Musculoskeletal: Positive for arthralgias.  Skin: Positive for wound.     Physical Exam Updated Vital Signs BP (!) 127/102    Pulse (!) 59    Temp 98.7 F (37.1 C) (Oral)    Resp 16    SpO2 98%   Physical Exam Vitals signs and nursing note reviewed.  Constitutional:      General: She is not in acute distress.    Appearance: She is well-developed. She is not diaphoretic.  HENT:     Head: Normocephalic and atraumatic.      Comments: Swelling and ecchymosis about the right eye    Mouth/Throat:     Pharynx: No oropharyngeal exudate.  Eyes:     General: No scleral icterus.       Right eye: No discharge.        Left eye: No discharge.     Extraocular Movements: Extraocular movements intact.     Conjunctiva/sclera: Conjunctivae normal.     Pupils: Pupils are equal, round, and reactive to light.  Neck:     Musculoskeletal: Normal range of motion and neck supple.     Thyroid: No thyromegaly.  Cardiovascular:     Rate  and Rhythm: Normal rate and regular rhythm.     Heart sounds: Normal heart sounds. No murmur. No friction rub. No gallop.   Pulmonary:     Effort: Pulmonary effort is normal. No respiratory distress.     Breath sounds: Normal breath sounds. No stridor. No wheezing or rales.  Abdominal:     General: Bowel sounds are normal. There is no distension.     Palpations: Abdomen is soft.     Tenderness: There is no abdominal tenderness. There is no guarding or rebound.  Musculoskeletal:     Comments: C-collar in place No midline thoracic or lumbar tenderness Tenderness to the anatomical snuffbox of the right wrist; chronic bony mass over the right wrist No tenderness about the elbow or shoulder No hip tenderness  Lymphadenopathy:     Cervical: No cervical adenopathy.  Skin:    General: Skin is warm and dry.     Coloration: Skin is not pale.     Findings: No rash.    Neurological:     Mental Status: She is alert.     Coordination: Coordination normal.     Comments: Patient demented, repetitive questioning at baseline.      ED Treatments / Results  Labs (all labs ordered are listed, but only abnormal results are displayed) Labs Reviewed - No data to display  EKG None  Radiology Dg Wrist Complete Right  Result Date: 03/08/2019 CLINICAL DATA:  [place], right wrist pain EXAM: RIGHT WRIST - COMPLETE 3+ VIEW COMPARISON:  None. FINDINGS: Severe arthrosis of the right wrist may limit detection of subtle underlying fractures. No definite fracture or acute traumatic malalignment of the wrist is seen. Mild circumferential swelling is noted. There is extensive chondrocalcinosis of the wrist and hand. Extensive bony remodeling of the first carpometacarpal joint is noted as well as the triscaphe joint. Sclerotic changes and subchondral cystic formation is noted along the radiocarpal articulation is well. IMPRESSION: 1. No definite fracture or acute traumatic malalignment of the wrist. 2. Severe arthrosis of the first carpometacarpal joint and triscaphe joint. Moderate radiocarpal arthrosis as well. 3. 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 Electronically Signed   By: Lovena Le M.D.   On: 03/08/2019 21:02   Ct Head Wo Contrast  Result Date: 03/08/2019 CLINICAL DATA:  Facial injury after fall. EXAM: CT HEAD WITHOUT CONTRAST CT MAXILLOFACIAL WITHOUT CONTRAST CT CERVICAL SPINE WITHOUT CONTRAST TECHNIQUE: Multidetector CT imaging of the head, cervical spine, and maxillofacial structures were performed using the standard protocol without intravenous contrast. Multiplanar CT image reconstructions of the cervical spine and maxillofacial structures were also generated. COMPARISON:  April 09, 2018. FINDINGS: CT HEAD FINDINGS Brain: Mild diffuse cortical atrophy is noted. Mild chronic ischemic white matter disease is  noted. No mass effect or midline shift is noted. Ventricular size is within normal limits. There is no evidence of mass lesion, hemorrhage or acute infarction. Vascular: No hyperdense vessel or unexpected calcification. Skull: Normal. Negative for fracture or focal lesion. Other: None. CT MAXILLOFACIAL FINDINGS Osseous: No fracture or mandibular dislocation. No destructive process. Orbits: Metallic density is seen in adjacent to medial portion of left lobe. Orbits are otherwise unremarkable. Sinuses: Clear. Soft tissues: Large soft tissue contusion is seen overlying right maxillary sinus and right infraorbital rim. CT CERVICAL SPINE FINDINGS Alignment: Grade 1 anterolisthesis of C3-4 and C4-5 is noted secondary to posterior facet joint hypertrophy. Skull base and vertebrae: No acute fracture. No primary bone lesion or focal pathologic  process. Soft tissues and spinal canal: No prevertebral fluid or swelling. No visible canal hematoma. Disc levels: Severe degenerative disc disease is noted at C4-5, C5-6 and C6-7. Upper chest: Negative. Other: None. IMPRESSION: 1. Mild diffuse cortical atrophy. Mild chronic ischemic white matter disease. No acute intracranial abnormality seen. 2. Large soft tissue contusion is seen overlying right maxillary sinus and right infraorbital rim. No fracture or mandibular dislocation is noted. 3. Severe multilevel degenerative disc disease. No acute abnormality seen in the cervical spine. Electronically Signed   By: Marijo Conception M.D.   On: 03/08/2019 21:15   Ct Cervical Spine Wo Contrast  Result Date: 03/08/2019 CLINICAL DATA:  Facial injury after fall. EXAM: CT HEAD WITHOUT CONTRAST CT MAXILLOFACIAL WITHOUT CONTRAST CT CERVICAL SPINE WITHOUT CONTRAST TECHNIQUE: Multidetector CT imaging of the head, cervical spine, and maxillofacial structures were performed using the standard protocol without intravenous contrast. Multiplanar CT image reconstructions of the cervical spine and  maxillofacial structures were also generated. COMPARISON:  April 09, 2018. FINDINGS: CT HEAD FINDINGS Brain: Mild diffuse cortical atrophy is noted. Mild chronic ischemic white matter disease is noted. No mass effect or midline shift is noted. Ventricular size is within normal limits. There is no evidence of mass lesion, hemorrhage or acute infarction. Vascular: No hyperdense vessel or unexpected calcification. Skull: Normal. Negative for fracture or focal lesion. Other: None. CT MAXILLOFACIAL FINDINGS Osseous: No fracture or mandibular dislocation. No destructive process. Orbits: Metallic density is seen in adjacent to medial portion of left lobe. Orbits are otherwise unremarkable. Sinuses: Clear. Soft tissues: Large soft tissue contusion is seen overlying right maxillary sinus and right infraorbital rim. CT CERVICAL SPINE FINDINGS Alignment: Grade 1 anterolisthesis of C3-4 and C4-5 is noted secondary to posterior facet joint hypertrophy. Skull base and vertebrae: No acute fracture. No primary bone lesion or focal pathologic process. Soft tissues and spinal canal: No prevertebral fluid or swelling. No visible canal hematoma. Disc levels: Severe degenerative disc disease is noted at C4-5, C5-6 and C6-7. Upper chest: Negative. Other: None. IMPRESSION: 1. Mild diffuse cortical atrophy. Mild chronic ischemic white matter disease. No acute intracranial abnormality seen. 2. Large soft tissue contusion is seen overlying right maxillary sinus and right infraorbital rim. No fracture or mandibular dislocation is noted. 3. Severe multilevel degenerative disc disease. No acute abnormality seen in the cervical spine. Electronically Signed   By: Marijo Conception M.D.   On: 03/08/2019 21:15   Ct Maxillofacial Wo Contrast  Result Date: 03/08/2019 CLINICAL DATA:  Facial injury after fall. EXAM: CT HEAD WITHOUT CONTRAST CT MAXILLOFACIAL WITHOUT CONTRAST CT CERVICAL SPINE WITHOUT CONTRAST TECHNIQUE: Multidetector CT imaging of  the head, cervical spine, and maxillofacial structures were performed using the standard protocol without intravenous contrast. Multiplanar CT image reconstructions of the cervical spine and maxillofacial structures were also generated. COMPARISON:  April 09, 2018. FINDINGS: CT HEAD FINDINGS Brain: Mild diffuse cortical atrophy is noted. Mild chronic ischemic white matter disease is noted. No mass effect or midline shift is noted. Ventricular size is within normal limits. There is no evidence of mass lesion, hemorrhage or acute infarction. Vascular: No hyperdense vessel or unexpected calcification. Skull: Normal. Negative for fracture or focal lesion. Other: None. CT MAXILLOFACIAL FINDINGS Osseous: No fracture or mandibular dislocation. No destructive process. Orbits: Metallic density is seen in adjacent to medial portion of left lobe. Orbits are otherwise unremarkable. Sinuses: Clear. Soft tissues: Large soft tissue contusion is seen overlying right maxillary sinus and right infraorbital rim. CT CERVICAL SPINE  FINDINGS Alignment: Grade 1 anterolisthesis of C3-4 and C4-5 is noted secondary to posterior facet joint hypertrophy. Skull base and vertebrae: No acute fracture. No primary bone lesion or focal pathologic process. Soft tissues and spinal canal: No prevertebral fluid or swelling. No visible canal hematoma. Disc levels: Severe degenerative disc disease is noted at C4-5, C5-6 and C6-7. Upper chest: Negative. Other: None. IMPRESSION: 1. Mild diffuse cortical atrophy. Mild chronic ischemic white matter disease. No acute intracranial abnormality seen. 2. Large soft tissue contusion is seen overlying right maxillary sinus and right infraorbital rim. No fracture or mandibular dislocation is noted. 3. Severe multilevel degenerative disc disease. No acute abnormality seen in the cervical spine. Electronically Signed   By: Marijo Conception M.D.   On: 03/08/2019 21:15    Procedures .Marland KitchenLaceration  Repair  Date/Time: 03/08/2019 10:27 PM Performed by: Frederica Kuster, PA-C Authorized by: Frederica Kuster, PA-C   Consent:    Consent obtained:  Verbal   Consent given by:  Patient   Risks discussed:  Infection, pain, poor cosmetic result and poor wound healing   Alternatives discussed:  No treatment Anesthesia (see MAR for exact dosages):    Anesthesia method:  Local infiltration   Local anesthetic:  Lidocaine 1% w/o epi Laceration details:    Location:  Face   Face location:  R upper eyelid   Extent:  Full thickness   Length (cm):  3 Repair type:    Repair type:  Simple Pre-procedure details:    Preparation:  Patient was prepped and draped in usual sterile fashion and imaging obtained to evaluate for foreign bodies Exploration:    Hemostasis achieved with:  Direct pressure   Wound exploration: wound explored through full range of motion and entire depth of wound probed and visualized     Wound extent: no foreign bodies/material noted and no muscle damage noted     Contaminated: no   Treatment:    Area cleansed with:  Saline   Amount of cleaning:  Standard   Irrigation solution:  Sterile saline   Irrigation volume:  100   Irrigation method:  Syringe   Visualized foreign bodies/material removed: no   Skin repair:    Repair method:  Sutures   Suture size:  5-0   Wound skin closure material used: Vicryl rapide.   Suture technique:  Simple interrupted   Number of sutures:  5 Approximation:    Approximation:  Close Post-procedure details:    Dressing:  Non-adherent dressing   Patient tolerance of procedure:  Tolerated well, no immediate complications .Marland KitchenLaceration Repair  Date/Time: 03/08/2019 10:28 PM Performed by: Frederica Kuster, PA-C Authorized by: Frederica Kuster, PA-C   Consent:    Consent obtained:  Verbal   Consent given by:  Patient   Risks discussed:  Infection, poor cosmetic result, pain and poor wound healing   Alternatives discussed:  No  treatment Anesthesia (see MAR for exact dosages):    Anesthesia method:  Local infiltration   Local anesthetic:  Lidocaine 1% w/o epi Laceration details:    Location:  Face   Face location:  R cheek   Length (cm):  1 Repair type:    Repair type:  Simple Pre-procedure details:    Preparation:  Patient was prepped and draped in usual sterile fashion and imaging obtained to evaluate for foreign bodies Exploration:    Hemostasis achieved with:  Direct pressure   Wound exploration: wound explored through full range of motion and entire depth of  wound probed and visualized     Wound extent: no foreign bodies/material noted, no muscle damage noted and no underlying fracture noted     Contaminated: no   Treatment:    Area cleansed with:  Saline   Amount of cleaning:  Standard   Irrigation solution:  Sterile saline   Irrigation volume:  50cc   Irrigation method:  Syringe   Visualized foreign bodies/material removed: no   Skin repair:    Repair method:  Sutures   Suture size:  5-0   Wound skin closure material used: Vicryl rapide.   Suture technique:  Simple interrupted   Number of sutures:  3 Approximation:    Approximation:  Close Post-procedure details:    Dressing:  Non-adherent dressing   Patient tolerance of procedure:  Tolerated well, no immediate complications   (including critical care time)  Medications Ordered in ED Medications  lidocaine (PF) (XYLOCAINE) 1 % injection 10 mL (has no administration in time range)     Initial Impression / Assessment and Plan / ED Course  I have reviewed the triage vital signs and the nursing notes.  Pertinent labs & imaging results that were available during my care of the patient were reviewed by me and considered in my medical decision making (see chart for details).        Patient presenting following fall with lacerations to the right eyebrow and right cheek repaired as above.  Tetanus is up-to-date.  Patient also with right  wrist pain.  There is no fracture seen on x-ray, however patient has anatomical snuffbox tenderness.  Will place in Velcro thumb spica to be worn at all times except bathing and follow-up to hand surgery for repeat x-ray in 7 to 10 days.  Patient son understands and agrees with plan.  Wound care discussed.  Suture removal advised if sutures are not falling out 5 to 7 days.  CT head, C-spine, maxillofacial are negative for fracture or intracranial hemorrhage.  Ice discussed.  Return precautions discussed.  Patient son understands and agrees with plan.  Patient vitals stable throughout ED course and discharged in satisfactory condition.  Final Clinical Impressions(s) / ED Diagnoses   Final diagnoses:  Fall, initial encounter  Contusion of face, initial encounter  Facial laceration, initial encounter  Right wrist pain    ED Discharge Orders    None         Frederica Kuster, PA-C 03/08/19 2243    Lucrezia Starch, MD 03/09/19 1131

## 2019-03-08 NOTE — Discharge Instructions (Addendum)
Treatment: Keep your wound dry and dressing applied until this time tomorrow. After 24 hours, you may wash with warm soapy water. Dry and apply clean dressing. Do this daily until your sutures are removed.  Follow-up: Please follow-up with Dr. Brennan Bailey office in 10 days for repeat x-ray of the wrist to rule out scaphoid fracture that could have been missed on today's x-ray.  Make sure to wear splint at all times except when bathing.  Please follow-up with your primary care provider or return to emergency department in 7 days for suture removal if sutures are not falling out on their own. Be aware of signs of infection: fever, increasing pain, redness, swelling, drainage from the area. Please call your primary care provider or return to emergency department if you develop any of these symptoms or if any of the sutures come out prior to removal. Please return to the emergency department if you develop any other new or worsening symptoms.

## 2019-03-09 NOTE — Progress Notes (Signed)
Orthopedic Tech Progress Note Patient Details:  Sheri Miller 21-Feb-1924 [id number]  Ortho Devices Type of Ortho Device: Thumb velcro splint Ortho Device/Splint Location: rue. applied at drs request Ortho Device/Splint Interventions: Ordered, Application, Adjustment   Post Interventions Patient Tolerated: Well Instructions Provided: Care of device, Adjustment of device   Karolee Stamps 03/09/2019, 2:52 AM

## 2019-03-13 ENCOUNTER — Ambulatory Visit (INDEPENDENT_AMBULATORY_CARE_PROVIDER_SITE_OTHER): Payer: Medicare HMO

## 2019-03-13 ENCOUNTER — Encounter: Payer: Self-pay | Admitting: Family Medicine

## 2019-03-13 ENCOUNTER — Ambulatory Visit (INDEPENDENT_AMBULATORY_CARE_PROVIDER_SITE_OTHER): Payer: Medicare HMO | Admitting: Family Medicine

## 2019-03-13 VITALS — BP 136/81 | HR 81 | Temp 98.1°F | Wt 126.6 lb

## 2019-03-13 DIAGNOSIS — M898X1 Other specified disorders of bone, shoulder: Secondary | ICD-10-CM

## 2019-03-13 DIAGNOSIS — W19XXXD Unspecified fall, subsequent encounter: Secondary | ICD-10-CM | POA: Diagnosis not present

## 2019-03-13 DIAGNOSIS — M25511 Pain in right shoulder: Secondary | ICD-10-CM

## 2019-03-13 DIAGNOSIS — S4991XA Unspecified injury of right shoulder and upper arm, initial encounter: Secondary | ICD-10-CM | POA: Diagnosis not present

## 2019-03-13 DIAGNOSIS — S0180XD Unspecified open wound of other part of head, subsequent encounter: Secondary | ICD-10-CM

## 2019-03-13 DIAGNOSIS — M25531 Pain in right wrist: Secondary | ICD-10-CM | POA: Diagnosis not present

## 2019-03-13 NOTE — Patient Instructions (Addendum)
  Good news.  We did not see a fracture on the x-ray of your collarbone or shoulder.  Can use sling until follow-up visit with Dr. Lenon Curt if needed.  Continue to wear wrist brace at all times until recheck with Dr. Winona Legato.  Tylenol as needed for pain.  If stronger medication is needed, can discuss other options.   Follow-up in 4 days for suture removal.  Return to the clinic or go to the nearest emergency room if any of your symptoms worsen or new symptoms occur.     If you have lab work done today you will be contacted with your lab results within the next 2 weeks.  If you have not heard from [place] then please contact us. The fastest way to get your results is to register for My Chart.   IF you received an x-ray today, you will receive an invoice from [facility]. Please contact [facility] at [phone] with questions or concerns regarding your invoice.   IF you received labwork today, you will receive an invoice from [facility]. Please contact [facility] at [phone] with questions or concerns regarding your invoice.   Our billing staff will not be able to assist you with questions regarding bills from these companies.  You will be contacted with the lab results as soon as they are available. The fastest way to get your results is to activate your My Chart account. Instructions are located on the last page of this paperwork. If you have not heard from [place] regarding the results in 2 weeks, please contact this office.

## 2019-03-13 NOTE — Progress Notes (Signed)
Subjective:  Patient ID: Sheri Miller, female    DOB: 06/16/1923  Age: 85 y.o. MRN: [id number]  CC:  Chief Complaint  Patient presents with  . Fall    was seen at Urgent care on 03/08/19 then went to ER had a Ct scan and it was normal. Patient still having pain in her right head, right shoulder and wrist. Patient is due to have suture out tomorrow and want to see if we can go ahead to take out sutures    HPI Sheri Miller presents for    Hospital/ER follow-up.  Evaluated November 27 at [facility], ER after a fall, contusion of face and laceration of face with right wrist pain.  She had tripped on a step going to the grocery store.  Hit her head on a metal chair next step.  No loss of consciousness.  Did have right wrist pain after fall on outstretched hand.  Had a laceration repair to her right upper eyelid, right cheek.  Right wrist x-ray with underlying arthrosis but no apparent fracture or acute traumatic malalignment of the wrist.  CT head, cervical spine, maxillofacial face without fractures or acute bleed.  No acute abnormalities.  ER note reviewed, was having snuffbox tenderness, placed in thumb spica with plan for orthopedic follow-up, Dr. Lenon Curt around December 7.  Suture removal in 5 to 7 days initially planned.  Patient here with son today who provides some history.  Pain in face and wrist initially, now appears to have more pain in her right shoulder and collarbone area.  She is guarding movement of her arm but has been taken off the splint frequently.  Has been treating pain with Tylenol which has been helpful.  Bruising has been improving, as well as swelling of face.  History Patient Active Problem List   Diagnosis Date Noted  . Dementia ([place]) 10/24/2018   Past Medical History:  Diagnosis Date  . Hyperlipidemia   . Hypertension    History reviewed. No pertinent surgical history. No Known Allergies Prior to Admission medications   Medication Sig Start Date End Date Taking?  Authorizing Provider  escitalopram (LEXAPRO) 10 MG tablet Take 1 tablet (10 mg total) by mouth at bedtime. 10/29/18  Yes Wendie Agreste, MD  levothyroxine (SYNTHROID) 50 MCG tablet TAKE 1 TABLET(50 MCG) BY MOUTH DAILY 10/29/18  Yes Wendie Agreste, MD  lisinopril (ZESTRIL) 20 MG tablet TAKE 1 TABLET BY MOUTH EVERY DAY 10/29/18  Yes Wendie Agreste, MD   Social History   Socioeconomic History  . Marital status: Single    Spouse name: Not on file  . Number of children: 4  . Years of education: Not on file  . Highest education level: Not on file  Occupational History  . Not on file  Social Needs  . Financial resource strain: Not on file  . Food insecurity    Worry: Not on file    Inability: Not on file  . Transportation needs    Medical: Not on file    Non-medical: Not on file  Tobacco Use  . Smoking status: Never Smoker  . Smokeless tobacco: Never Used  Substance and Sexual Activity  . Alcohol use: Never    Frequency: Never  . Drug use: Never  . Sexual activity: Not on file  Lifestyle  . Physical activity    Days per week: Not on file    Minutes per session: Not on file  . Stress: Not on file  Relationships  .  Social Herbalist on phone: Not on file    Gets together: Not on file    Attends religious service: Not on file    Active member of club or organization: Not on file    Attends meetings of clubs or organizations: Not on file    Relationship status: Not on file  . Intimate partner violence    Fear of current or ex partner: Not on file    Emotionally abused: Not on file    Physically abused: Not on file    Forced sexual activity: Not on file  Other Topics Concern  . Not on file  Social History Narrative   Pt lives with her son and his family (wife and 2 sons) in 2 story home   Has 4 children   8th grade education   Retired Regulatory affairs officer     Review of Systems   Objective:   Vitals:   03/13/19 1541  BP: 136/81  Pulse: 81  Temp: 98.1 F  (36.7 C)  TempSrc: Oral  SpO2: 97%  Weight: 126 lb 9.6 oz (57.4 kg)     Physical Exam Vitals signs reviewed.  Constitutional:      General: She is not in acute distress.    Appearance: She is well-developed.  HENT:     Head:   Eyes:   Cardiovascular:     Rate and Rhythm: Normal rate.  Pulmonary:     Effort: Pulmonary effort is normal.  Musculoskeletal:     Right shoulder: She exhibits decreased range of motion, tenderness and bony tenderness (mid clavicle, no soft tissure swelling. ).     Right wrist: She exhibits tenderness and bony tenderness (distal radius, scaphoid. echymosis of wrist to hand with soft tissue swelling. ).       Arms:  Neurological:     Mental Status: She is alert and oriented to person, place, and time.    Dg Clavicle Right  Result Date: 03/13/2019 CLINICAL DATA:  Right shoulder pain after fall last week. EXAM: RIGHT CLAVICLE - 2+ VIEWS COMPARISON:  None. FINDINGS: There is no evidence of fracture or other focal bone lesions. Soft tissues are unremarkable. IMPRESSION: Negative. Electronically Signed   By: Marijo Conception M.D.   On: 03/13/2019 17:22   Dg Shoulder Right  Result Date: 03/13/2019 CLINICAL DATA:  Right mid clavicular pain. EXAM: RIGHT SHOULDER - 2+ VIEW COMPARISON:  None. FINDINGS: There is no evidence of fracture or dislocation. There is mild arthropathy of the acromioclavicular joint. Soft tissues are unremarkable. IMPRESSION: No acute osseous injury of the right shoulder. Electronically Signed   By: Kathreen Devoid   On: 03/13/2019 17:22       Assessment & Plan:  Sheri Miller is a 85 y.o. female . Fall, subsequent encounter Pain in joint of right shoulder - Plan: DG Shoulder Right, Sling Pain of right clavicle - Plan: DG Shoulder Right, DG Clavicle Right, Sling  -No apparent fracture on x-ray.  Guarded exam.  Differential includes rotator cuff injury.  However localizes primarily over clavicle.  -Shoulder sling placed, follow-up with  orthopedics as planned next week.  Right wrist pain - Plan: Sling  -Still point tender over distal radius and scaphoid without apparent fracture on initial imaging, may have been somewhat limited by degree of arthrosis.  Sling as above, continue wrist brace until evaluation with orthopedics.  Tylenol as needed for pain for now, but if not improving consider stronger option.  Cautioned given age and  risk of falling.  Continue Tylenol for now.   open wound of face, subsequent encounter  -Vicryl sutures intact, no surrounding erythema or sign of infection.  Recheck in 4 days for removal.  Second MD exam obtained.   No orders of the defined types were placed in this encounter.  Patient Instructions    Good news.  We did not see a fracture on the x-ray of your collarbone or shoulder.  Can use sling until follow-up visit with Dr. Lenon Curt if needed.  Continue to wear wrist brace at all times until recheck with Dr. Winona Legato.  Tylenol as needed for pain.  If stronger medication is needed, can discuss other options.   Follow-up in 4 days for suture removal.  Return to the clinic or go to the nearest emergency room if any of your symptoms worsen or new symptoms occur.     If you have lab work done today you will be contacted with your lab results within the next 2 weeks.  If you have not heard from [place] then please contact us. The fastest way to get your results is to register for My Chart.   IF you received an x-ray today, you will receive an invoice from [facility]. Please contact [facility] at [phone] with questions or concerns regarding your invoice.   IF you received labwork today, you will receive an invoice from [facility]. Please contact [facility] at [phone] with questions or concerns regarding your invoice.   Our billing staff will not be able to assist you with questions regarding bills from these companies.  You will be contacted with the lab results  as soon as they are available. The fastest way to get your results is to activate your My Chart account. Instructions are located on the last page of this paperwork. If you have not heard from [place] regarding the results in 2 weeks, please contact this office.          Signed, Merri Ray, MD [facility]

## 2019-03-18 ENCOUNTER — Other Ambulatory Visit: Payer: Self-pay

## 2019-03-18 ENCOUNTER — Ambulatory Visit (INDEPENDENT_AMBULATORY_CARE_PROVIDER_SITE_OTHER): Payer: Medicare HMO | Admitting: Family Medicine

## 2019-03-18 ENCOUNTER — Encounter: Payer: Self-pay | Admitting: Family Medicine

## 2019-03-18 VITALS — BP 137/80 | HR 67 | Temp 98.0°F | Wt 129.6 lb

## 2019-03-18 DIAGNOSIS — M545 Low back pain, unspecified: Secondary | ICD-10-CM

## 2019-03-18 DIAGNOSIS — S0180XD Unspecified open wound of other part of head, subsequent encounter: Secondary | ICD-10-CM

## 2019-03-18 DIAGNOSIS — W19XXXD Unspecified fall, subsequent encounter: Secondary | ICD-10-CM

## 2019-03-18 DIAGNOSIS — Z23 Encounter for immunization: Secondary | ICD-10-CM | POA: Diagnosis not present

## 2019-03-18 NOTE — Patient Instructions (Addendum)
  Sutures removed without difficulty.  If any persistent back pain later this week, let me know and I will order an x-ray.  Heat to affected area and gentle range of motion is okay for now, Tylenol is okay if needed for now.  Return to the clinic or go to the nearest emergency room if any of your symptoms worsen or new symptoms occur.    If you have lab work done today you will be contacted with your lab results within the next 2 weeks.  If you have not heard from [place] then please contact us. The fastest way to get your results is to register for My Chart.   IF you received an x-ray today, you will receive an invoice from [facility]. Please contact [facility] at [phone] with questions or concerns regarding your invoice.   IF you received labwork today, you will receive an invoice from [place]. Please contact [facility] at [phone] with questions or concerns regarding your invoice.   Our billing staff will not be able to assist you with questions regarding bills from these companies.  You will be contacted with the lab results as soon as they are available. The fastest way to get your results is to activate your My Chart account. Instructions are located on the last page of this paperwork. If you have not heard from [place] regarding the results in 2 weeks, please contact this office.

## 2019-03-18 NOTE — Progress Notes (Signed)
Subjective:  Patient ID: Sheri Miller, female    DOB: 1923/05/30  Age: 85 y.o. MRN: [id number]  CC:  Chief Complaint  Patient presents with  . Wound Check    5 day f/u wound recheck and eval of the face.  [facility] fall    back has been hurting for the past 2 day. Not sure if it cause she has been sitting more than usual of from the fall    HPI [facility] presents for face wound, back pain. Follow-up from fall.  See previous notes.  November 27 had sutures placed on right face wound.  Evaluated last week, healing well but did not feel that suture removal was appropriate that time.  Follow-up planned with Ortho/hand surgeon for right wrist injury. Vicryl sutures were intact at last visit.  5 on upper wound, 3 and lower.   Last 2 days has been complaining of lower back pain - has been sitting more recently. Ambulating on own.  Tylenol has been helpful. 2 times per day. No initial back pain after fall.   XR lumbar spine in July: IMPRESSION: Mild spondylosis of the lumbar spine to include moderate facet arthropathy. Moderate multilevel disc disease from the L3-4 level to the L5-S1 level and also at the T12-L1 level.  Grade 1 anterolisthesis of L3 on L4 due to the moderate facet arthropathy.  History Patient Active Problem List   Diagnosis Date Noted  . Dementia ([place]) 10/24/2018   Past Medical History:  Diagnosis Date  . Hyperlipidemia   . Hypertension    No past surgical history on file. No Known Allergies Prior to Admission medications   Medication Sig Start Date End Date Taking? Authorizing Provider  escitalopram (LEXAPRO) 10 MG tablet Take 1 tablet (10 mg total) by mouth at bedtime. 10/29/18  Yes Wendie Agreste, MD  levothyroxine (SYNTHROID) 50 MCG tablet TAKE 1 TABLET(50 MCG) BY MOUTH DAILY 10/29/18  Yes Wendie Agreste, MD  lisinopril (ZESTRIL) 20 MG tablet TAKE 1 TABLET BY MOUTH EVERY DAY 10/29/18  Yes Wendie Agreste, MD   Social History   Socioeconomic  History  . Marital status: Single    Spouse name: Not on file  . Number of children: 4  . Years of education: Not on file  . Highest education level: Not on file  Occupational History  . Not on file  Social Needs  . Financial resource strain: Not on file  . Food insecurity    Worry: Not on file    Inability: Not on file  . Transportation needs    Medical: Not on file    Non-medical: Not on file  Tobacco Use  . Smoking status: Never Smoker  . Smokeless tobacco: Never Used  Substance and Sexual Activity  . Alcohol use: Never    Frequency: Never  . Drug use: Never  . Sexual activity: Not on file  Lifestyle  . Physical activity    Days per week: Not on file    Minutes per session: Not on file  . Stress: Not on file  Relationships  . Social Herbalist on phone: Not on file    Gets together: Not on file    Attends religious service: Not on file    Active member of club or organization: Not on file    Attends meetings of clubs or organizations: Not on file    Relationship status: Not on file  . Intimate partner violence    Fear  of current or ex partner: Not on file    Emotionally abused: Not on file    Physically abused: Not on file    Forced sexual activity: Not on file  Other Topics Concern  . Not on file  Social History Narrative   Pt lives with her son and his family (wife and 2 sons) in 2 story home   Has 4 children   8th grade education   Retired Regulatory affairs officer     Review of Systems Per HPI.  Objective:   Vitals:   03/18/19 1648  BP: 137/80  Pulse: 67  Temp: 98 F (36.7 C)  TempSrc: Temporal  SpO2: 97%  Weight: 129 lb 9.6 oz (58.8 kg)     Physical Exam Constitutional:      General: She is not in acute distress.    Appearance: She is well-developed.  HENT:     Head: Normocephalic and atraumatic.  Cardiovascular:     Rate and Rhythm: Normal rate.  Pulmonary:     Effort: Pulmonary effort is normal.  Musculoskeletal:     Lumbar back: She  exhibits no bony tenderness (No midline bony tenderness.  Antalgic gait, similar to previous exam.).  Skin:         Comments: Facial wounds healing, 5 simple interrupted sutures in place upper wound, 3 on lower wound, removed without difficulty.  No widening with tension at wound, no surrounding erythema  Neurological:     Mental Status: She is alert and oriented to person, place, and time.        Assessment & Plan:  Sheri Miller is a 85 y.o. female . Open wound of face, subsequent encounter Fall, subsequent encounter  -Wounds healing well, sutures removed without difficulty as above.  Need for prophylactic vaccination and inoculation against influenza - Plan: Flu Vaccine QUAD High Dose(Fluad)  Low back pain, unspecified back pain laterality, unspecified chronicity, unspecified whether sciatica present  -No initial pain after fall.  Potentially could have some stiffness/chronic back pain flare with decreased activity.  No midline bony tenderness, less likely compression fracture.  Option of x-ray given if persistent pain or worsening.  Episodic Tylenol okay if needed.  No orders of the defined types were placed in this encounter.  Patient Instructions    Sutures removed without difficulty.  If any persistent back pain later this week, let me know and I will order an x-ray.  Heat to affected area and gentle range of motion is okay for now, Tylenol is okay if needed for now.  Return to the clinic or go to the nearest emergency room if any of your symptoms worsen or new symptoms occur.    If you have lab work done today you will be contacted with your lab results within the next 2 weeks.  If you have not heard from [place] then please contact us. The fastest way to get your results is to register for My Chart.   IF you received an x-ray today, you will receive an invoice from [facility]. Please contact [facility] at [phone] with questions or concerns regarding  your invoice.   IF you received labwork today, you will receive an invoice from [facility]. Please contact [facility] at [phone] with questions or concerns regarding your invoice.   Our billing staff will not be able to assist you with questions regarding bills from these companies.  You will be contacted with the lab results as soon as they are available. The fastest way to get your  results is to activate your My Chart account. Instructions are located on the last page of this paperwork. If you have not heard from [place] regarding the results in 2 weeks, please contact this office.          Signed, Merri Ray, MD [facility]

## 2019-03-19 ENCOUNTER — Encounter: Payer: Self-pay | Admitting: Family Medicine

## 2019-03-20 DIAGNOSIS — S63601A Unspecified sprain of right thumb, initial encounter: Secondary | ICD-10-CM | POA: Diagnosis not present

## 2019-04-02 ENCOUNTER — Telehealth: Payer: Self-pay | Admitting: *Deleted

## 2019-04-02 NOTE — Telephone Encounter (Signed)
Spoke with daughter her mom has Dementia will book AWV when she is coming into office for another visit so a face to face can be done with daughter.

## 2019-04-17 ENCOUNTER — Other Ambulatory Visit: Payer: Self-pay | Admitting: Family Medicine

## 2019-04-17 DIAGNOSIS — I10 Essential (primary) hypertension: Secondary | ICD-10-CM

## 2019-04-17 NOTE — Telephone Encounter (Signed)
Requested Prescriptions  Pending Prescriptions Disp Refills  . lisinopril (ZESTRIL) 20 MG tablet [Pharmacy Med Name: LISINOPRIL 20MG  TABLETS] 90 tablet 1    Sig: TAKE 1 TABLET BY MOUTH EVERY DAY     Cardiovascular:  ACE Inhibitors Failed - 04/17/2019  9:02 AM      Failed - Cr in normal range and within 180 days    Creatinine, Ser  Date Value Ref Range Status  10/29/2018 1.54 (H) 0.57 - 1.00 mg/dL Final         Passed - K in normal range and within 180 days    Potassium  Date Value Ref Range Status  10/29/2018 4.8 3.5 - 5.2 mmol/L Final         Passed - Patient is not pregnant      Passed - Last BP in normal range    BP Readings from Last 1 Encounters:  03/18/19 137/80         Passed - Valid encounter within last 6 months    Recent Outpatient Visits          1 month ago Open wound of face, subsequent encounter   [facility], Ranell Patrick, MD   1 month ago Fall, subsequent encounter   [facility], Ranell Patrick, MD   4 months ago Hypothyroidism, unspecified type   [facility], Ranell Patrick, MD   5 months ago Dementia with behavioral disturbance, unspecified dementia type [facility])   [facility], Ranell Patrick, MD   7 months ago Hip pain, left   [facility], Ranell Patrick, MD

## 2019-04-24 ENCOUNTER — Telehealth: Payer: Self-pay | Admitting: *Deleted

## 2019-04-24 NOTE — Telephone Encounter (Signed)
Spoke with daughter, mom has dementia will schedule in office visit when COVID slows down.

## 2019-04-25 DIAGNOSIS — Z23 Encounter for immunization: Secondary | ICD-10-CM | POA: Diagnosis not present

## 2019-05-06 ENCOUNTER — Other Ambulatory Visit: Payer: Self-pay | Admitting: Family Medicine

## 2019-05-06 DIAGNOSIS — F0391 Unspecified dementia with behavioral disturbance: Secondary | ICD-10-CM

## 2019-05-19 ENCOUNTER — Other Ambulatory Visit: Payer: Self-pay | Admitting: Family Medicine

## 2019-05-19 DIAGNOSIS — E039 Hypothyroidism, unspecified: Secondary | ICD-10-CM

## 2019-05-19 NOTE — Telephone Encounter (Signed)
Requested medication (s) are due for refill today: yes  Requested medication (s) are on the active medication list: yes  Last refill:  10/29/18  Future visit scheduled: no  Notes to clinic:  Overdue for labs Last TSH 10/29/18 = 5.140- please review   Requested Prescriptions  Pending Prescriptions Disp Refills   levothyroxine (SYNTHROID) 50 MCG tablet [Pharmacy Med Name: LEVOTHYROXINE 0.05MG  (50MCG) TAB] 90 tablet 1    Sig: TAKE 1 TABLET(50 MCG) BY MOUTH DAILY      Endocrinology:  Hypothyroid Agents Failed - 05/19/2019 10:36 AM      Failed - TSH needs to be rechecked within 3 months after an abnormal result. Refill until TSH is due.      Failed - TSH in normal range and within 360 days    TSH  Date Value Ref Range Status  10/29/2018 5.140 (H) 0.450 - 4.500 uIU/mL Final          Passed - Valid encounter within last 12 months    Recent Outpatient Visits           2 months ago Open wound of face, subsequent encounter   [facility], Ranell Patrick, MD   2 months ago Fall, subsequent encounter   [facility], Ranell Patrick, MD   5 months ago Hypothyroidism, unspecified type   [facility], Ranell Patrick, MD   6 months ago Dementia with behavioral disturbance, unspecified dementia type [facility])   [facility], Ranell Patrick, MD   8 months ago Hip pain, left   [facility], Ranell Patrick, MD       Future Appointments             In 1 week Delice Lesch, Lezlie Octave, MD [facility] [place]

## 2019-05-20 NOTE — Telephone Encounter (Signed)
No further refills without office visit 

## 2019-05-23 DIAGNOSIS — Z23 Encounter for immunization: Secondary | ICD-10-CM | POA: Diagnosis not present

## 2019-05-28 ENCOUNTER — Encounter: Payer: Self-pay | Admitting: Neurology

## 2019-05-29 ENCOUNTER — Telehealth (INDEPENDENT_AMBULATORY_CARE_PROVIDER_SITE_OTHER): Payer: Medicare HMO | Admitting: Neurology

## 2019-05-29 ENCOUNTER — Encounter: Payer: Self-pay | Admitting: Neurology

## 2019-05-29 ENCOUNTER — Other Ambulatory Visit: Payer: Self-pay

## 2019-05-29 VITALS — Ht 61.0 in | Wt 110.0 lb

## 2019-05-29 DIAGNOSIS — F0391 Unspecified dementia with behavioral disturbance: Secondary | ICD-10-CM

## 2019-05-29 DIAGNOSIS — R69 Illness, unspecified: Secondary | ICD-10-CM | POA: Diagnosis not present

## 2019-05-29 DIAGNOSIS — F03B18 Unspecified dementia, moderate, with other behavioral disturbance: Secondary | ICD-10-CM

## 2019-05-29 MED ORDER — DIVALPROEX SODIUM 125 MG PO CSDR: DELAYED_RELEASE_CAPSULE | ORAL | 11 refills | Status: DC
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
## 2019-08-14 ENCOUNTER — Telehealth: Payer: Self-pay | Admitting: Family Medicine

## 2019-08-14 NOTE — Telephone Encounter (Signed)
Please Advise

## 2019-08-14 NOTE — Telephone Encounter (Signed)
[employer] paper work for patient's son to be completed by provider placed in Provider/ CMA box

## 2019-08-15 NOTE — Telephone Encounter (Signed)
I have the paperwork that was faxed I't all looks complete and the provider has signed it. Recommend an appointment if more action is needed.

## 2019-08-18 ENCOUNTER — Other Ambulatory Visit: Payer: Self-pay | Admitting: Family Medicine

## 2019-08-18 DIAGNOSIS — E039 Hypothyroidism, unspecified: Secondary | ICD-10-CM

## 2019-08-18 NOTE — Telephone Encounter (Signed)
Requested medications are due for refill today?  Yes  Requested medications are on active medication list?  Yes  Last Refill:  05/20/2019  # 90 with no refill  Future visit scheduled?  No  Notes to Clinic:  Patient was seen by provider on 12/10/2018 and informed patient TSH would need to be checked again in 6 weeks.  TSH has not been rechecked.  Order is in place, but patient has not returned for lab work.

## 2019-08-19 ENCOUNTER — Other Ambulatory Visit: Payer: Self-pay | Admitting: Family Medicine

## 2019-08-19 DIAGNOSIS — E039 Hypothyroidism, unspecified: Secondary | ICD-10-CM

## 2019-08-19 NOTE — Telephone Encounter (Signed)
No further refills without office visit 

## 2019-09-02 ENCOUNTER — Telehealth: Payer: Self-pay | Admitting: Family Medicine

## 2019-09-02 NOTE — Telephone Encounter (Signed)
Pt son Son called State his need the [employer] paper Works to be [employer] and Send to [employer] son need the paper works a soon is possible please Advise

## 2019-09-02 NOTE — Telephone Encounter (Signed)
Paperwork has been placed in pcp folder at nurses station

## 2019-09-02 NOTE — Telephone Encounter (Signed)
disability paperwork received via fax from [employer] to be completed by provider/placed paperwork  in provider / cma mail box at nurses station

## 2019-09-10 NOTE — Telephone Encounter (Signed)
Im just checking to see if either of you have these forms the patient is looking for. If so Can you please let me know so I can let the patient know we are working on the forms.

## 2019-09-10 NOTE — Telephone Encounter (Signed)
[employer] paper work for patient's son to be completed by provider placed in Provider/ CMA box

## 2019-09-15 ENCOUNTER — Other Ambulatory Visit: Payer: Self-pay | Admitting: Family Medicine

## 2019-09-15 DIAGNOSIS — E039 Hypothyroidism, unspecified: Secondary | ICD-10-CM

## 2019-09-15 NOTE — Telephone Encounter (Signed)
Requested Prescriptions  Pending Prescriptions Disp Refills  . levothyroxine (SYNTHROID) 50 MCG tablet [Pharmacy Med Name: LEVOTHYROXINE 0.05MG  (50MCG) TAB] 90 tablet 0    Sig: TAKE 1 TABLET(50 MCG) BY MOUTH DAILY     Endocrinology:  Hypothyroid Agents Failed - 09/15/2019  8:41 AM      Failed - TSH needs to be rechecked within 3 months after an abnormal result. Refill until TSH is due.      Failed - TSH in normal range and within 360 days    TSH  Date Value Ref Range Status  10/29/2018 5.140 (H) 0.450 - 4.500 uIU/mL Final         Passed - Valid encounter within last 12 months    Recent Outpatient Visits          6 months ago Open wound of face, subsequent encounter   [facility], Ranell Patrick, MD   6 months ago Fall, subsequent encounter   [facility], Ranell Patrick, MD   9 months ago Hypothyroidism, unspecified type   [facility] [place], MD   10 months ago Dementia with behavioral disturbance, unspecified dementia type [facility])   [facility] [place], MD   1 year ago Hip pain, left   [facility], Ranell Patrick, MD      Future Appointments            In 3 days Wendie Agreste, MD [facility] [place], [facility]

## 2019-09-17 NOTE — Telephone Encounter (Signed)
I have still not seen these forms. Will forward to [place] as well

## 2019-09-18 ENCOUNTER — Other Ambulatory Visit: Payer: Self-pay | Admitting: Family Medicine

## 2019-09-18 ENCOUNTER — Encounter: Payer: Self-pay | Admitting: Family Medicine

## 2019-09-18 ENCOUNTER — Ambulatory Visit (INDEPENDENT_AMBULATORY_CARE_PROVIDER_SITE_OTHER): Payer: Medicare HMO | Admitting: Family Medicine

## 2019-09-18 ENCOUNTER — Other Ambulatory Visit: Payer: Self-pay

## 2019-09-18 VITALS — BP 132/60 | HR 50 | Temp 98.0°F | Wt 125.0 lb

## 2019-09-18 DIAGNOSIS — H40059 Ocular hypertension, unspecified eye: Secondary | ICD-10-CM | POA: Diagnosis not present

## 2019-09-18 DIAGNOSIS — N189 Chronic kidney disease, unspecified: Secondary | ICD-10-CM

## 2019-09-18 DIAGNOSIS — E039 Hypothyroidism, unspecified: Secondary | ICD-10-CM

## 2019-09-18 DIAGNOSIS — F0391 Unspecified dementia with behavioral disturbance: Secondary | ICD-10-CM | POA: Diagnosis not present

## 2019-09-18 DIAGNOSIS — I10 Essential (primary) hypertension: Secondary | ICD-10-CM

## 2019-09-18 DIAGNOSIS — R69 Illness, unspecified: Secondary | ICD-10-CM | POA: Diagnosis not present

## 2019-09-18 MED ORDER — LATANOPROST 0.005 % OP SOLN: 1.0000 [drp] | Freq: Every day | OPHTHALMIC | 0 refills | Status: DC

## 2019-09-18 MED ORDER — LISINOPRIL 20 MG PO TABS: 20.0000 mg | ORAL_TABLET | Freq: Every day | ORAL | 2 refills | Status: DC

## 2019-09-18 MED ORDER — LEVOTHYROXINE SODIUM 50 MCG PO TABS: 50.0000 ug | ORAL_TABLET | Freq: Every day | ORAL | 2 refills | Status: DC

## 2019-09-18 NOTE — Progress Notes (Signed)
Subjective:  Patient ID: Sheri Miller, female    DOB: 07/02/1923  Age: 86 y.o. MRN: [id number]  CC:  Chief Complaint  Patient presents with  . Medication Refill    pt needs refill on Levothyroxine. pt is medication is working well with no side effects.pt's son is requestion a refill on Latanoprost ophtalmic solution 0.005% pt's eye doctor in [place] gave her this medication shortly after an eye suregery and the pt is going to run out any day now son would like more of this medication for his mother.    HPI Sheri Miller presents for   Medication review  Dementia: Treated by neurology, Dr. Delice Lesch, currently prescribed Lexapro and Depakote. Living with son.  Eating ok, getting by ok at home.   Has ppwk for her son - FMLA paperwork. Had appt with hearing aids on 5/5 FMLA expired on 5/4.  Hypertension: Lisinopril 20 mg daily, CKD with creatinine 1.54 in July 2020. Home readings: Constitutional: Negative for fatigue and unexpected weight change Respiratory: Negative for cough, chest tightness and shortness of breath.   Cardiovascular: Negative for chest pain, palpitations or new leg swelling.  Gastrointestinal: Negative for abdominal pain and blood in stool.  Neurological: Negative for dizziness, light-headedness and headaches.     BP Readings from Last 3 Encounters:  09/18/19 132/60  03/18/19 137/80  03/13/19 136/81   Lab Results  Component Value Date   CREATININE 1.54 (H) 10/29/2018    Requests refill of latonprost.   Prescribed by her ophthalmologist in [place], running out.  Legally blind. Uses latanoprost 0.005% 1 drop in each eye at bedtime for pressure in eyes. Refills from [facility]. Saw [facility]. In past 9mo - 1 year.   Hypothyroidism: Lab Results  Component Value Date   TSH 5.140 (H) 10/29/2018   Borderline TSH last year.  Continued on same dose Synthroid with plan on lab only visit when discussed August 31 on telemedicine visit.  Has not had repeat testing.    Taking synthroid 41mcg QD. No missed doses. Taking medication daily. Per son - no new hot or cold intolerance. No new hair or skin changes, heart palpitations or new fatigue. No new weight changes.    Gait abnormality See prior notes.  Degenerative disc disease with degenerative grade 1 spondylolisthesis on x-ray.  Thought to have Trendelenburg gait from abductor weakness.  Referred to physical therapy.  Denied pain.minimal changes. Deferred PT. Doing ok  - not needing any assistive device. No falls.   History Patient Active Problem List   Diagnosis Date Noted  . Dementia ([place]) 10/24/2018   Past Medical History:  Diagnosis Date  . Hyperlipidemia   . Hypertension    No past surgical history on file. No Known Allergies Prior to Admission medications   Medication Sig Start Date End Date Taking? Authorizing Provider  divalproex (DEPAKOTE SPRINKLE) 125 MG capsule Take 1 capsule in the evening as needed for agitation 05/29/19  Yes [name], MD  escitalopram (LEXAPRO) 10 MG tablet TAKE 1 TABLET(10 MG) BY MOUTH AT BEDTIME 05/06/19  Yes Wendie Agreste, MD  latanoprost (XALATAN) 0.005 % ophthalmic solution 1 drop at bedtime.   Yes [provider]  levothyroxine (SYNTHROID) 50 MCG tablet TAKE 1 TABLET(50 MCG) BY MOUTH DAILY 09/15/19  Yes Wendie Agreste, MD  lisinopril (ZESTRIL) 20 MG tablet TAKE 1 TABLET BY MOUTH EVERY DAY 04/17/19  Yes Wendie Agreste, MD  Multiple Vitamins-Minerals (ULTRA WOMENS PACK PO) Take by mouth.   Yes  [provider]  omega-3 acid ethyl esters (LOVAZA) 1 g capsule Take by mouth 2 (two) times daily.   Yes [provider]   Social History   Socioeconomic History  . Marital status: Single    Spouse name: Not on file  . Number of children: 4  . Years of education: Not on file  . Highest education level: Not on file  Occupational History  . Not on file  Tobacco Use  . Smoking status: Never Smoker  . Smokeless tobacco: Never  Used  Substance and Sexual Activity  . Alcohol use: Never  . Drug use: Never  . Sexual activity: Not on file  Other Topics Concern  . Not on file  Social History Narrative   Pt lives with her son and his family (wife and 2 sons) in 2 story home   Has 4 children   8th grade education   Retired Regulatory affairs officer    Social Determinants of Radio broadcast assistant Strain:   . Difficulty of Paying Living Expenses:   Food Insecurity:   . Worried About Charity fundraiser in the Last Year:   . Arboriculturist in the Last Year:   Transportation Needs:   . Film/video editor (Medical):   Marland Kitchen Lack of Transportation (Non-Medical):   Physical Activity:   . Days of Exercise per Week:   . Minutes of Exercise per Session:   Stress:   . Feeling of Stress :   Social Connections:   . Frequency of Communication with Friends and Family:   . Frequency of Social Gatherings with Friends and Family:   . Attends Religious Services:   . Active Member of Clubs or Organizations:   . Attends Archivist Meetings:   Marland Kitchen Marital Status:   Intimate Partner Violence:   . Fear of Current or Ex-Partner:   . Emotionally Abused:   Marland Kitchen Physically Abused:   . Sexually Abused:     Review of Systems Per HPI.   Objective:   Vitals:   09/18/19 1629 09/18/19 1637  BP: (!) 157/81 132/60  Pulse: (!) 50   Temp: 98 F (36.7 C)   TempSrc: Temporal   SpO2: 95%   Weight: 125 lb (56.7 kg)      Physical Exam Vitals reviewed.  Constitutional:      Appearance: She is well-developed.  HENT:     Head: Normocephalic and atraumatic.  Eyes:     Conjunctiva/sclera: Conjunctivae normal.     Pupils: Pupils are equal, round, and reactive to light.  Neck:     Vascular: No carotid bruit.  Cardiovascular:     Rate and Rhythm: Normal rate and regular rhythm.     Heart sounds: Normal heart sounds.  Pulmonary:     Effort: Pulmonary effort is normal.     Breath sounds: Normal breath sounds.  Abdominal:      Palpations: Abdomen is soft. There is no pulsatile mass.     Tenderness: There is no abdominal tenderness.  Skin:    General: Skin is warm and dry.  Neurological:     Mental Status: She is alert and oriented to person, place, and time.  Psychiatric:        Mood and Affect: Mood normal.        Behavior: Behavior normal.     Comments: Pleasant, responsive, no distress.      Assessment & Plan:  Sheri Miller is a 86 y.o. female . Hypothyroidism, unspecified  type - Plan: TSH + free T4, levothyroxine (SYNTHROID) 50 MCG tablet  -  Stable, tolerating current regimen. Borderline tsh prior. No new symptoms -  Medications refilled. Labs pending as above.   Essential hypertension - Plan: lisinopril (ZESTRIL) 20 MG tablet  - The current medical regimen is effective;  continue present plan and medications.  Dementia with behavioral disturbance, unspecified dementia type ([place])  - stable on meds above, continue neuro follow up.   Raised intraocular pressure, unspecified laterality - Plan: latanoprost (XALATAN) 0.005 % ophthalmic solution  -Agreed on temporary refill latanoprost until she can follow-up with her eye care provider.  If needed can refer to ophthalmology.  Likely needs updated pressure testing.  Chronic kidney disease, unspecified CKD stage - Plan: Basic metabolic panel  -Avoidance of NSAIDs, maintenance of hydration, RTC precautions.  Check BMP  Meds ordered this encounter  Medications  . levothyroxine (SYNTHROID) 50 MCG tablet    Sig: Take 1 tablet (50 mcg total) by mouth daily before breakfast.    Dispense:  90 tablet    Refill:  2  . lisinopril (ZESTRIL) 20 MG tablet    Sig: Take 1 tablet (20 mg total) by mouth daily.    Dispense:  90 tablet    Refill:  2  . latanoprost (XALATAN) 0.005 % ophthalmic solution    Sig: Place 1 drop into both eyes at bedtime.    Dispense:  2.5 mL    Refill:  0   Patient Instructions    Contact [facility]. I can refill the eyedrops once  until seen. If you need a referral to ophthalmologist, let me know.    No change in medications for now.  Make sure to drink plenty of fluids, avoid NSAIDs such as Advil, Aleve, ibuprofen.  If any difficulty urinating or new symptoms please return for recheck.  I will check thyroid today and let you know if they are changes needed.   Take care.    If you have lab work done today you will be contacted with your lab results within the next 2 weeks.  If you have not heard from [place] then please contact us. The fastest way to get your results is to register for My Chart.   IF you received an x-ray today, you will receive an invoice from [facility]. Please contact [facility] at [phone] with questions or concerns regarding your invoice.   IF you received labwork today, you will receive an invoice from [facility]. Please contact [facility] at [phone] with questions or concerns regarding your invoice.   Our billing staff will not be able to assist you with questions regarding bills from these companies.  You will be contacted with the lab results as soon as they are available. The fastest way to get your results is to activate your My Chart account. Instructions are located on the last page of this paperwork. If you have not heard from [place] regarding the results in 2 weeks, please contact this office.         Signed, Merri Ray, MD [facility]

## 2019-09-18 NOTE — Patient Instructions (Addendum)
  [facility]. I can refill the eyedrops once until seen. If you need a referral to ophthalmologist, let me know.    No change in medications for now.  Make sure to drink plenty of fluids, avoid NSAIDs such as Advil, Aleve, ibuprofen.  If any difficulty urinating or new symptoms please return for recheck.  I will check thyroid today and let you know if they are changes needed.   Take care.    If you have lab work done today you will be contacted with your lab results within the next 2 weeks.  If you have not heard from [place] then please contact us. The fastest way to get your results is to register for My Chart.   IF you received an x-ray today, you will receive an invoice from [facility]. Please contact [facility] at [phone] with questions or concerns regarding your invoice.   IF you received labwork today, you will receive an invoice from [facility]. Please contact [facility] at [phone] with questions or concerns regarding your invoice.   Our billing staff will not be able to assist you with questions regarding bills from these companies.  You will be contacted with the lab results as soon as they are available. The fastest way to get your results is to activate your My Chart account. Instructions are located on the last page of this paperwork. If you have not heard from [place] regarding the results in 2 weeks, please contact this office.

## 2019-09-19 LAB — TSH+FREE T4
Free T4: 0.9 ng/dL (ref 0.82–1.77)
TSH: 4.39 u[IU]/mL (ref 0.450–4.500)

## 2019-09-19 LAB — BASIC METABOLIC PANEL
BUN/Creatinine Ratio: 26 (ref 12–28)
BUN: 38 mg/dL — ABNORMAL HIGH (ref 10–36)
CO2: 18 mmol/L — ABNORMAL LOW (ref 20–29)
Calcium: 9.3 mg/dL (ref 8.7–10.3)
Chloride: 99 mmol/L (ref 96–106)
Creatinine, Ser: 1.47 mg/dL — ABNORMAL HIGH (ref 0.57–1.00)
GFR calc Af Amer: 35 mL/min/{1.73_m2} — ABNORMAL LOW (ref >59)
GFR calc non Af Amer: 30 mL/min/{1.73_m2} — ABNORMAL LOW (ref >59)
Glucose: 98 mg/dL (ref 65–99)
Potassium: 4.6 mmol/L (ref 3.5–5.2)
Sodium: 137 mmol/L (ref 134–144)

## 2019-09-19 NOTE — Telephone Encounter (Signed)
I have not had any FMLA paperwork come in to Dr.Greene's box in a while the pt may need to have the paperwork refaxed or drop of a copy to [place] directly.

## 2019-09-22 DIAGNOSIS — H40029 Open angle with borderline findings, high risk, unspecified eye: Secondary | ICD-10-CM | POA: Diagnosis not present

## 2019-10-28 ENCOUNTER — Other Ambulatory Visit: Payer: Self-pay | Admitting: Family Medicine

## 2019-10-28 DIAGNOSIS — F0391 Unspecified dementia with behavioral disturbance: Secondary | ICD-10-CM

## 2019-12-27 ENCOUNTER — Encounter: Payer: Self-pay | Admitting: Neurology

## 2019-12-27 ENCOUNTER — Ambulatory Visit: Payer: Medicare HMO | Admitting: Neurology

## 2019-12-27 ENCOUNTER — Other Ambulatory Visit: Payer: Self-pay

## 2019-12-27 DIAGNOSIS — F0391 Unspecified dementia with behavioral disturbance: Secondary | ICD-10-CM

## 2019-12-27 DIAGNOSIS — R69 Illness, unspecified: Secondary | ICD-10-CM | POA: Diagnosis not present

## 2019-12-27 NOTE — Patient Instructions (Signed)
1. Continue Lexapro 10mg  daily  2. Continue 24/7 care  3. Follow-up in 1 year, call for any changes
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
## 2019-12-31 ENCOUNTER — Other Ambulatory Visit: Payer: Self-pay | Admitting: Family Medicine

## 2019-12-31 DIAGNOSIS — E039 Hypothyroidism, unspecified: Secondary | ICD-10-CM

## 2019-12-31 NOTE — Telephone Encounter (Signed)
Requested medication (s) are due for refill today - yes  Requested medication (s) are on the active medication list -no  Future visit scheduled -yes  Last refill: 09/18/19  Notes to clinic: Medication is not currently on list- may need to be- Rx 09/18/19 #90 2 RF- sent for review  Requested Prescriptions  Pending Prescriptions Disp Refills   levothyroxine (SYNTHROID) 50 MCG tablet [Pharmacy Med Name: LEVOTHYROXINE 0.05MG  (50MCG) TAB] 90 tablet 0    Sig: TAKE 1 TABLET(50 MCG) BY MOUTH DAILY      Endocrinology:  Hypothyroid Agents Failed - 12/31/2019  9:00 AM      Failed - TSH needs to be rechecked within 3 months after an abnormal result. Refill until TSH is due.      Passed - TSH in normal range and within 360 days    TSH  Date Value Ref Range Status  09/18/2019 4.390 0.450 - 4.500 uIU/mL Final          Passed - Valid encounter within last 12 months    Recent Outpatient Visits           3 months ago Hypothyroidism, unspecified type   [facility], Ranell Patrick, MD   9 months ago Open wound of face, subsequent encounter   [facility], Ranell Patrick, MD   9 months ago Fall, subsequent encounter   [facility], Ranell Patrick, MD   1 year ago Hypothyroidism, unspecified type   [facility], Ranell Patrick, MD   1 year ago Dementia with behavioral disturbance, unspecified dementia type [facility])   [facility], Ranell Patrick, MD       Future Appointments             In 2 months Carlota Raspberry Ranell Patrick, MD [facility] [place], [facility]                Requested Prescriptions  Pending Prescriptions Disp Refills   levothyroxine (SYNTHROID) 50 MCG tablet [Pharmacy Med Name: LEVOTHYROXINE 0.05MG  (50MCG) TAB] 90 tablet 0    Sig: TAKE 1 TABLET(50 MCG) BY MOUTH DAILY      Endocrinology:  Hypothyroid Agents Failed - 12/31/2019  9:00 AM      Failed - TSH needs to be rechecked within 3 months after an abnormal result.  Refill until TSH is due.      Passed - TSH in normal range and within 360 days    TSH  Date Value Ref Range Status  09/18/2019 4.390 0.450 - 4.500 uIU/mL Final          Passed - Valid encounter within last 12 months    Recent Outpatient Visits           3 months ago Hypothyroidism, unspecified type   [facility], Ranell Patrick, MD   9 months ago Open wound of face, subsequent encounter   [facility], Ranell Patrick, MD   9 months ago Fall, subsequent encounter   [facility], Ranell Patrick, MD   1 year ago Hypothyroidism, unspecified type   [facility], Ranell Patrick, MD   1 year ago Dementia with behavioral disturbance, unspecified dementia type [facility])   [facility], Ranell Patrick, MD       Future Appointments             In 2 months Wendie Agreste, MD  [facility] at [place], [facility]

## 2020-02-03 ENCOUNTER — Telehealth: Payer: Self-pay | Admitting: Family Medicine

## 2020-02-03 NOTE — Telephone Encounter (Signed)
I have the paper work and will bring this the the attention of the provider and get it faxed back.

## 2020-02-03 NOTE — Telephone Encounter (Signed)
[employer] paper work is being faxed over today.and they are needing the dates to state 01/29/20 through 07/29/20 this  should be a 6 month period. Please advise [name] at [phone]

## 2020-02-07 DIAGNOSIS — H401131 Primary open-angle glaucoma, bilateral, mild stage: Secondary | ICD-10-CM | POA: Diagnosis not present

## 2020-02-07 DIAGNOSIS — H353231 Exudative age-related macular degeneration, bilateral, with active choroidal neovascularization: Secondary | ICD-10-CM | POA: Diagnosis not present

## 2020-02-21 DIAGNOSIS — R69 Illness, unspecified: Secondary | ICD-10-CM | POA: Diagnosis not present

## 2020-03-09 ENCOUNTER — Other Ambulatory Visit: Payer: Self-pay

## 2020-03-18 ENCOUNTER — Ambulatory Visit (INDEPENDENT_AMBULATORY_CARE_PROVIDER_SITE_OTHER): Payer: Medicare HMO | Admitting: Family Medicine

## 2020-03-18 ENCOUNTER — Encounter: Payer: Self-pay | Admitting: Family Medicine

## 2020-03-18 ENCOUNTER — Other Ambulatory Visit: Payer: Self-pay

## 2020-03-18 VITALS — BP 158/76 | HR 61 | Temp 97.6°F | Ht 61.0 in | Wt 128.0 lb

## 2020-03-18 DIAGNOSIS — E039 Hypothyroidism, unspecified: Secondary | ICD-10-CM | POA: Diagnosis not present

## 2020-03-18 DIAGNOSIS — F0391 Unspecified dementia with behavioral disturbance: Secondary | ICD-10-CM

## 2020-03-18 DIAGNOSIS — R69 Illness, unspecified: Secondary | ICD-10-CM | POA: Diagnosis not present

## 2020-03-18 DIAGNOSIS — I10 Essential (primary) hypertension: Secondary | ICD-10-CM | POA: Diagnosis not present

## 2020-03-18 MED ORDER — LEVOTHYROXINE SODIUM 50 MCG PO TABS: 50.0000 ug | ORAL_TABLET | Freq: Every day | ORAL | 2 refills | Status: DC

## 2020-03-18 MED ORDER — AMLODIPINE BESYLATE 2.5 MG PO TABS: 2.5000 mg | ORAL_TABLET | Freq: Every day | ORAL | 2 refills | Status: AC

## 2020-03-18 MED ORDER — LISINOPRIL 20 MG PO TABS: 20.0000 mg | ORAL_TABLET | Freq: Every day | ORAL | 2 refills | Status: DC

## 2020-03-18 NOTE — Progress Notes (Signed)
Subjective:  Patient ID: Sheri Miller, female    DOB: 04-28-1923  Age: 86 y.o. MRN: [id number]  CC:  Chief Complaint  Patient presents with  . Follow-up    on hypothyroidism and hypertension. Pt's son reports no taking the pt's BP at home. pt's son states he was told to stop giving the pt her synthroid. I couldn't fide a note in the chart stating this. no change to condition since last OV.    HPI Sheri Miller presents for   Hypothyroidism: Lab Results  Component Value Date   TSH 4.390 09/18/2019  Taking medication daily.  Synthroid 50 mcg daily prior. Stopped after last appointment - was advised to stop meds?  No new hot or cold intolerance. No new hair or skin changes - chronic dry skin.  heart palpitations or new fatigue. No new weight changes.   Dementia Moderate dementia with behavioral*disturbance, likely Alzheimer's disease, evaluated by neurology, Dr. Delice Lesch September 17.  Note reviewed.  Nonpharmacologic management strategies for behavioral changes were discussed, and continued on Lexapro 10 mg daily.   24-hour 7-day a week care with her son.  Hypertension: With CKD.  Creatinine range 1.18-1.54 past 2 years.  Lisinopril 20mg  qd. No missed doses.  Home readings: none.  BP Readings from Last 3 Encounters:  03/18/20 (!) 158/76  12/27/19 (!) 151/68  09/18/19 132/60   Lab Results  Component Value Date   CREATININE 1.47 (H) 09/18/2019    Had flu vaccine, has received covid vaccines and booster.   History Patient Active Problem List   Diagnosis Date Noted  . Dementia ([place]) 10/24/2018   Past Medical History:  Diagnosis Date  . Hyperlipidemia   . Hypertension    No past surgical history on file. No Known Allergies Prior to Admission medications   Medication Sig Start Date End Date Taking? Authorizing Provider  escitalopram (LEXAPRO) 10 MG tablet TAKE 1 TABLET(10 MG) BY MOUTH AT BEDTIME 10/28/19  Yes Wendie Agreste, MD  latanoprost (XALATAN) 0.005 % ophthalmic  solution INSTILL 1 DROP IN BOTH EYES AT BEDTIME 09/18/19  Yes Wendie Agreste, MD  lisinopril (ZESTRIL) 20 MG tablet Take 1 tablet (20 mg total) by mouth daily. 09/18/19  Yes Wendie Agreste, MD  Multiple Vitamins-Minerals (ULTRA WOMENS PACK PO) Take by mouth.   Yes [provider]  omega-3 acid ethyl esters (LOVAZA) 1 g capsule Take by mouth 2 (two) times daily.   Yes [provider]  levothyroxine (SYNTHROID) 50 MCG tablet TAKE 1 TABLET(50 MCG) BY MOUTH DAILY Patient not taking: Reported on 03/18/2020 12/31/19   Wendie Agreste, MD   Social History   Socioeconomic History  . Marital status: Single    Spouse name: Not on file  . Number of children: 4  . Years of education: Not on file  . Highest education level: Not on file  Occupational History  . Not on file  Tobacco Use  . Smoking status: Never Smoker  . Smokeless tobacco: Never Used  Vaping Use  . Vaping Use: Never used  Substance and Sexual Activity  . Alcohol use: Never  . Drug use: Never  . Sexual activity: Not on file  Other Topics Concern  . Not on file  Social History Narrative   Pt lives with her son and his family (wife and 2 sons) in 2 story home   Has 4 children   8th grade education   Retired Regulatory affairs officer    Social Determinants of [employer]  Resource Strain:   . Difficulty of Paying Living Expenses: Not on file  Food Insecurity:   . Worried About Charity fundraiser in the Last Year: Not on file  . Ran Out of Food in the Last Year: Not on file  Transportation Needs:   . Lack of Transportation (Medical): Not on file  . Lack of Transportation (Non-Medical): Not on file  Physical Activity:   . Days of Exercise per Week: Not on file  . Minutes of Exercise per Session: Not on file  Stress:   . Feeling of Stress : Not on file  Social Connections:   . Frequency of Communication with Friends and Family: Not on file  . Frequency of Social Gatherings with Friends and Family: Not on file   . Attends Religious Services: Not on file  . Active Member of Clubs or Organizations: Not on file  . Attends Archivist Meetings: Not on file  . Marital Status: Not on file  Intimate Partner Violence:   . Fear of Current or Ex-Partner: Not on file  . Emotionally Abused: Not on file  . Physically Abused: Not on file  . Sexually Abused: Not on file    Review of Systems  Constitutional: Negative for fatigue and unexpected weight change.  Respiratory: Negative for chest tightness and shortness of breath.   Cardiovascular: Negative for chest pain, palpitations and leg swelling.  Gastrointestinal: Negative for abdominal pain and blood in stool.  Neurological: Negative for dizziness, syncope, light-headedness and headaches.    Objective:   Vitals:   03/18/20 1609 03/18/20 1631  BP: (!) 162/80 (!) 158/76  Pulse: 61   Temp: 97.6 F (36.4 C)   TempSrc: Temporal   SpO2: 98%   Weight: 128 lb (58.1 kg)   Height: 5\' 1"  (1.549 m)      Physical Exam Vitals reviewed.  Constitutional:      Appearance: She is well-developed.  HENT:     Head: Normocephalic and atraumatic.  Eyes:     Conjunctiva/sclera: Conjunctivae normal.     Pupils: Pupils are equal, round, and reactive to light.  Neck:     Vascular: No carotid bruit.  Cardiovascular:     Rate and Rhythm: Normal rate and regular rhythm.     Heart sounds: Normal heart sounds.  Pulmonary:     Effort: Pulmonary effort is normal.     Breath sounds: Normal breath sounds.  Abdominal:     Palpations: Abdomen is soft. There is no pulsatile mass.     Tenderness: There is no abdominal tenderness.  Skin:    General: Skin is warm and dry.  Neurological:     Mental Status: She is alert and oriented to person, place, and time.  Psychiatric:        Behavior: Behavior normal.      Assessment & Plan:  Sheri Miller is a 86 y.o. female . Essential hypertension - Plan: amLODipine (NORVASC) 2.5 MG tablet, lisinopril (ZESTRIL) 20  MG tablet, Basic metabolic panel  - decreased control - add amlodipine, continue lisinopril.  Recheck in 6 weeks.   Hypothyroidism, unspecified type - Plan: levothyroxine (SYNTHROID) 50 MCG tablet, TSH + free T4  -Intermittently has stopped Synthroid, will check baseline level today, likely will restart 50 mcg daily, placed on hold of pharmacy, recheck 6 weeks for repeat testing.  Dementia with behavioral disturbance, unspecified dementia type (HCC)  -Overall stable, recent neuro eval above, continue Lexapro.  Meds ordered this encounter  Medications  .  levothyroxine (SYNTHROID) 50 MCG tablet    Sig: Take 1 tablet (50 mcg total) by mouth daily before breakfast.    Dispense:  90 tablet    Refill:  2    Can place on hold until lab results reviewed.  Marland Kitchen amLODipine (NORVASC) 2.5 MG tablet    Sig: Take 1 tablet (2.5 mg total) by mouth daily.    Dispense:  90 tablet    Refill:  2  . lisinopril (ZESTRIL) 20 MG tablet    Sig: Take 1 tablet (20 mg total) by mouth daily.    Dispense:  90 tablet    Refill:  2   Patient Instructions    I will check thyroid test, but may need to restart med. I will let you know.  Add amlodipine once per day, continue lisinopril.  No other med changes at this time.    If you have lab work done today you will be contacted with your lab results within the next 2 weeks.  If you have not heard from [place] then please contact us. The fastest way to get your results is to register for My Chart.   IF you received an x-ray today, you will receive an invoice from [facility]. Please contact [facility] at [phone] with questions or concerns regarding your invoice.   IF you received labwork today, you will receive an invoice from [facility]. Please contact [facility] at [phone] with questions or concerns regarding your invoice.   Our billing staff will not be able to assist you with questions regarding bills from these companies.  You will be  contacted with the lab results as soon as they are available. The fastest way to get your results is to activate your My Chart account. Instructions are located on the last page of this paperwork. If you have not heard from [place] regarding the results in 2 weeks, please contact this office.         Signed, Merri Ray, MD [facility]

## 2020-03-18 NOTE — Patient Instructions (Addendum)
  I will check thyroid test, but may need to restart med. I will let you know.  Add amlodipine once per day, continue lisinopril.  No other med changes at this time.    If you have lab work done today you will be contacted with your lab results within the next 2 weeks.  If you have not heard from [place] then please contact us. The fastest way to get your results is to register for My Chart.   IF you received an x-ray today, you will receive an invoice from [facility]. Please contact [facility] at [phone] with questions or concerns regarding your invoice.   IF you received labwork today, you will receive an invoice from [place]. Please contact [facility] at [phone] with questions or concerns regarding your invoice.   Our billing staff will not be able to assist you with questions regarding bills from these companies.  You will be contacted with the lab results as soon as they are available. The fastest way to get your results is to activate your My Chart account. Instructions are located on the last page of this paperwork. If you have not heard from [place] regarding the results in 2 weeks, please contact this office.

## 2020-03-19 LAB — BASIC METABOLIC PANEL
BUN/Creatinine Ratio: 17 (ref 12–28)
BUN: 26 mg/dL (ref 10–36)
CO2: 24 mmol/L (ref 20–29)
Calcium: 9.4 mg/dL (ref 8.7–10.3)
Chloride: 101 mmol/L (ref 96–106)
Creatinine, Ser: 1.54 mg/dL — ABNORMAL HIGH (ref 0.57–1.00)
GFR calc Af Amer: 33 mL/min/{1.73_m2} — ABNORMAL LOW (ref >59)
GFR calc non Af Amer: 29 mL/min/{1.73_m2} — ABNORMAL LOW (ref >59)
Glucose: 98 mg/dL (ref 65–99)
Potassium: 5.1 mmol/L (ref 3.5–5.2)
Sodium: 137 mmol/L (ref 134–144)

## 2020-03-19 LAB — TSH+FREE T4
Free T4: 0.17 ng/dL — ABNORMAL LOW (ref 0.82–1.77)
TSH: 33.2 u[IU]/mL — ABNORMAL HIGH (ref 0.450–4.500)

## 2020-04-20 ENCOUNTER — Ambulatory Visit
Admission: RE | Admit: 2020-04-20 | Discharge: 2020-04-20 | Disposition: A | Discharge status: Home / Self Care | Payer: Medicare HMO | Source: Ambulatory Visit | Attending: Family Medicine | Admitting: Family Medicine

## 2020-04-20 ENCOUNTER — Ambulatory Visit (INDEPENDENT_AMBULATORY_CARE_PROVIDER_SITE_OTHER): Payer: Medicare HMO | Admitting: Family Medicine

## 2020-04-20 ENCOUNTER — Encounter: Payer: Self-pay | Admitting: Family Medicine

## 2020-04-20 ENCOUNTER — Other Ambulatory Visit: Payer: Self-pay

## 2020-04-20 VITALS — BP 135/70 | HR 65 | Temp 98.0°F | Ht 61.0 in | Wt 128.0 lb

## 2020-04-20 DIAGNOSIS — M7989 Other specified soft tissue disorders: Secondary | ICD-10-CM | POA: Diagnosis not present

## 2020-04-20 DIAGNOSIS — Y92009 Unspecified place in unspecified non-institutional (private) residence as the place of occurrence of the external cause: Secondary | ICD-10-CM | POA: Diagnosis not present

## 2020-04-20 DIAGNOSIS — M79605 Pain in left leg: Secondary | ICD-10-CM

## 2020-04-20 DIAGNOSIS — R269 Unspecified abnormalities of gait and mobility: Secondary | ICD-10-CM

## 2020-04-20 DIAGNOSIS — M1712 Unilateral primary osteoarthritis, left knee: Secondary | ICD-10-CM | POA: Diagnosis not present

## 2020-04-20 DIAGNOSIS — M545 Low back pain, unspecified: Secondary | ICD-10-CM

## 2020-04-20 DIAGNOSIS — W19XXXA Unspecified fall, initial encounter: Secondary | ICD-10-CM

## 2020-04-20 DIAGNOSIS — I708 Atherosclerosis of other arteries: Secondary | ICD-10-CM | POA: Diagnosis not present

## 2020-04-20 NOTE — Patient Instructions (Addendum)
I am glad to hear that you are improving now.  I will check an x-ray of your low back as well as the left lower leg as you are still sore in that area.  It is possible that the fall may have worsened your arthritis in the back and cause more pain temporarily.  If any worsening of symptoms from this point forward, please be seen here or other care provider.  Let me know if there are questions.  I will let you know if there are any concerns on x-ray.   Fall Prevention in the Home, Adult Falls can cause injuries and can affect people from all age groups. There are many simple things that you can do to make your home safe and to help prevent falls. Ask for help when making these changes, if needed. What actions can I take to prevent falls? General instructions  Use good lighting in all rooms. Replace any light bulbs that burn out.  Turn on lights if it is dark. Use night-lights.  Place frequently used items in easy-to-reach places. Lower the shelves around your home if necessary.  Set up furniture so that there are clear paths around it. Avoid moving your furniture around.  Remove throw rugs and other tripping hazards from the floor.  Avoid walking on wet floors.  Fix any uneven floor surfaces.  Add color or contrast paint or tape to grab bars and handrails in your home. Place contrasting color strips on the first and last steps of stairways.  When you use a stepladder, make sure that it is completely opened and that the sides are firmly locked. Have someone hold the ladder while you are using it. Do not climb a closed stepladder.  Be aware of any and all pets. What can I do in the bathroom?  Keep the floor dry. Immediately clean up any water that spills onto the floor.  Remove soap buildup in the tub or shower on a regular basis.  Use non-skid mats or decals on the floor of the tub or shower.  Attach bath mats securely with double-sided, non-slip rug tape.  If you need to sit down  while you are in the shower, use a plastic, non-slip stool.  Install grab bars by the toilet and in the tub and shower. Do not use towel bars as grab bars.      What can I do in the bedroom?  Make sure that a bedside light is easy to reach.  Do not use oversized bedding that drapes onto the floor.  Have a firm chair that has side arms to use for getting dressed. What can I do in the kitchen?  Clean up any spills right away.  If you need to reach for something above you, use a sturdy step stool that has a grab bar.  Keep electrical cables out of the way.  Do not use floor polish or wax that makes floors slippery. If you must use wax, make sure that it is non-skid floor wax. What can I do in the stairways?  Do not leave any items on the stairs.  Make sure that you have a light switch at the top of the stairs and the bottom of the stairs. Have them installed if you do not have them.  Make sure that there are handrails on both sides of the stairs. Fix handrails that are broken or loose. Make sure that handrails are as long as the stairways.  Install non-slip stair treads on  all stairs in your home.  Avoid having throw rugs at the top or bottom of stairways, or secure the rugs with carpet tape to prevent them from moving.  Choose a carpet design that does not hide the edge of steps on the stairway.  Check any carpeting to make sure that it is firmly attached to the stairs. Fix any carpet that is loose or worn. What can I do on the outside of my home?  Use bright outdoor lighting.  Regularly repair the edges of walkways and driveways and fix any cracks.  Remove high doorway thresholds.  Trim any shrubbery on the main path into your home.  Regularly check that handrails are securely fastened and in good repair. Both sides of any steps should have handrails.  Install guardrails along the edges of any raised decks or porches.  Clear walkways of debris and clutter, including  tools and rocks.  Have leaves, snow, and ice cleared regularly.  Use sand or salt on walkways during winter months.  In the garage, clean up any spills right away, including grease or oil spills. What other actions can I take?  Wear closed-toe shoes that fit well and support your feet. Wear shoes that have rubber soles or low heels.  Use mobility aids as needed, such as canes, walkers, scooters, and crutches.  Review your medicines with your health care provider. Some medicines can cause dizziness or changes in blood pressure, which increase your risk of falling. Talk with your health care provider about other ways that you can decrease your risk of falls. This may include working with a physical therapist or trainer to improve your strength, balance, and endurance. Where to find more information  Centers for Disease Control and Prevention, STEADI: WebmailGuide.co.za  [employer] on Aging: BrainJudge.co.uk Contact a health care provider if:  You are afraid of falling at home.  You feel weak, drowsy, or dizzy at home.  You fall at home. Summary  There are many simple things that you can do to make your home safe and to help prevent falls.  Ways to make your home safe include removing tripping hazards and installing grab bars in the bathroom.  Ask for help when making these changes in your home. This information is not intended to replace advice given to you by your health care provider. Make sure you discuss any questions you have with your health care provider. Document Revised: 03/10/2017 Document Reviewed: 11/10/2016 Elsevier Patient Education  2021 [employer].   If you have lab work done today you will be contacted with your lab results within the next 2 weeks.  If you have not heard from [place] then please contact us. The fastest way to get your results is to register for My Chart.   IF you received an x-ray today, you will receive an invoice from  [facility]. Please contact [facility] at [phone] with questions or concerns regarding your invoice.   IF you received labwork today, you will receive an invoice from [place]. Please contact [facility] at [phone] with questions or concerns regarding your invoice.   Our billing staff will not be able to assist you with questions regarding bills from these companies.  You will be contacted with the lab results as soon as they are available. The fastest way to get your results is to activate your My Chart account. Instructions are located on the last page of this paperwork. If you have not heard from [place] regarding the results in 2  weeks, please contact this office.

## 2020-04-20 NOTE — Progress Notes (Signed)
Subjective:  Patient ID: Sheri Miller, female    DOB: 17-Nov-1923  Age: 87 y.o. MRN: [id number]  CC:  Chief Complaint  Patient presents with  . Fall    Pt had a small fall on 04/15/2019 the pt fell flat onto her buttocks.unsure what cased the fall. A few days later the pt complained of back pain, and then a few after that the pt has been walking slowly and was limping/ dragging left leg.     HPI Sheri Miller presents for   Presents with son.   Fall Fall at home January 4th.  Son heard a thump in her room in middle of night. Found her sitting on buttocks. No LOC, no head injury. No bleeding. Chair had been knocked over. Fell flat onto her buttocks?  Unknown cause. Notes that she complained of low back pain initially, and since that time. 2 days ago - started dragging her left leg. increased difficulty walking last night, dragging leg. Used cane and assistance from son last night. This morning, returned to her normal walking, back at baseline. Not complaining of pain today.  No facial droop, slurred speech. No headaches.  Tylenol past 2 nights. None today.   She has a history of degenerative disc disease with degenerative spondylolisthesis.  Evaluated last July with possible abductor weakness and Trendelenburg gait.  Treated with physical therapy. Same gait - no change.  Degenerative changes in left hip on imaging in 2020.  History Patient Active Problem List   Diagnosis Date Noted  . Dementia ([place]) 10/24/2018   Past Medical History:  Diagnosis Date  . Hyperlipidemia   . Hypertension    No past surgical history on file. No Known Allergies Prior to Admission medications   Medication Sig Start Date End Date Taking? Authorizing Provider  amLODipine (NORVASC) 2.5 MG tablet Take 1 tablet (2.5 mg total) by mouth daily. 03/18/20  Yes Wendie Agreste, MD  escitalopram (LEXAPRO) 10 MG tablet TAKE 1 TABLET(10 MG) BY MOUTH AT BEDTIME 10/28/19  Yes Wendie Agreste, MD  latanoprost (XALATAN)  0.005 % ophthalmic solution INSTILL 1 DROP IN BOTH EYES AT BEDTIME 09/18/19  Yes Wendie Agreste, MD  levothyroxine (SYNTHROID) 50 MCG tablet Take 1 tablet (50 mcg total) by mouth daily before breakfast. 03/18/20  Yes Wendie Agreste, MD  lisinopril (ZESTRIL) 20 MG tablet Take 1 tablet (20 mg total) by mouth daily. 03/18/20  Yes Wendie Agreste, MD  Multiple Vitamins-Minerals (ULTRA WOMENS PACK PO) Take by mouth.   Yes [provider]  omega-3 acid ethyl esters (LOVAZA) 1 g capsule Take by mouth 2 (two) times daily.   Yes [provider]  divalproex (DEPAKOTE SPRINKLE) 125 MG capsule SMARTSIG:1 Capsule(s) By Mouth Every Evening PRN 03/16/20   [provider]   Social History   Socioeconomic History  . Marital status: Single    Spouse name: Not on file  . Number of children: 4  . Years of education: Not on file  . Highest education level: Not on file  Occupational History  . Not on file  Tobacco Use  . Smoking status: Never Smoker  . Smokeless tobacco: Never Used  Vaping Use  . Vaping Use: Never used  Substance and Sexual Activity  . Alcohol use: Never  . Drug use: Never  . Sexual activity: Not on file  Other Topics Concern  . Not on file  Social History Narrative   Pt lives with her son and his family (wife and  2 sons) in 2 story home   Has 4 children   8th grade education   Retired Regulatory affairs officer    Social Determinants of Radio broadcast assistant Strain: Not on [employer] Insecurity: Not on file  Transportation Needs: Not on file  Physical Activity: Not on file  Stress: Not on file  Social Connections: Not on file  Intimate Partner Violence: Not on file   Review of Systems  Per HPI.  Objective:   Vitals:   04/20/20 1408  BP: 135/70  Pulse: 65  Temp: 98 F (36.7 C)  TempSrc: Temporal  SpO2: 97%  Weight: 128 lb (58.1 kg)  Height: 5\' 1"  (1.549 m)     Physical Exam Constitutional:      General: She is not in acute distress.     Appearance: Normal appearance. She is well-developed and well-nourished. She is not ill-appearing or diaphoretic.  HENT:     Head: Normocephalic and atraumatic.  Cardiovascular:     Rate and Rhythm: Normal rate.  Pulmonary:     Effort: Pulmonary effort is normal.  Musculoskeletal:     Comments: Trendelenburg gait, similar to previous exams.  Able to ambulate without assistance device.  No focal weakness appreciated other than Trendelenburg gait. Thoracic spine, no midline bony tenderness. Lumbar spine no bony tenderness, no paraspinal tenderness.  Negative seated straight leg raise. Left hip, no focal bony tenderness, pain-free internal and external rotation. Left knee, femur nontender.  Pain-free range of motion of the knee. Tender to palpation the lower tibia few inches proximal to the malleoli.  Ankle nontender with pain-free range of motion.  Fibula nontender.  Foot nontender.  Neurological:     Mental Status: She is alert and oriented to person, place, and time.  Psychiatric:        Mood and Affect: Mood and affect normal.       Assessment & Plan:  Cailan Antonucci is a 87 y.o. female . Acute low back pain, unspecified back pain laterality, unspecified whether sciatica present - Plan: DG Lumbar Spine 2-3 Views  Abnormality of gait - Plan: DG Lumbar Spine 2-3 Views  Acute leg pain, left - Plan: DG Tibia/Fibula Left  Fall in home, initial encounter  Suspect mechanical fall at home, possibly tripped over a chair.  Initial low back pain, then delayed presentation of worsening antalgic gait with underlying Trendelenburg gait.  Unlikely CVA/TIA with delay in onset of symptoms and now resolved.  Neuroimaging deferred at this time.  Symptoms improved today and has returned to her normal gait.  Possible flare of degenerative disc disease, history of degenerative spondylolisthesis.  Doubt hip injury/pelvic injury with pain-free exam today and able to weight-bear without apparent pain.  Still  tender along her lower medial tibia, will check imaging as well as imaging of low back to evaluate changes in previous listhesis.  RTC precautions if worsening symptoms.  No orders of the defined types were placed in this encounter.  Patient Instructions    I am glad to hear that you are improving now.  I will check an x-ray of your low back as well as the left lower leg as you are still sore in that area.  It is possible that the fall may have worsened your arthritis in the back and cause more pain temporarily.  If any worsening of symptoms from this point forward, please be seen here or other care provider.  Let me know if there are questions.  I will let  you know if there are any concerns on x-ray.   Fall Prevention in the Home, Adult Falls can cause injuries and can affect people from all age groups. There are many simple things that you can do to make your home safe and to help prevent falls. Ask for help when making these changes, if needed. What actions can I take to prevent falls? General instructions  Use good lighting in all rooms. Replace any light bulbs that burn out.  Turn on lights if it is dark. Use night-lights.  Place frequently used items in easy-to-reach places. Lower the shelves around your home if necessary.  Set up furniture so that there are clear paths around it. Avoid moving your furniture around.  Remove throw rugs and other tripping hazards from the floor.  Avoid walking on wet floors.  Fix any uneven floor surfaces.  Add color or contrast paint or tape to grab bars and handrails in your home. Place contrasting color strips on the first and last steps of stairways.  When you use a stepladder, make sure that it is completely opened and that the sides are firmly locked. Have someone hold the ladder while you are using it. Do not climb a closed stepladder.  Be aware of any and all pets. What can I do in the bathroom?  Keep the floor dry. Immediately clean  up any water that spills onto the floor.  Remove soap buildup in the tub or shower on a regular basis.  Use non-skid mats or decals on the floor of the tub or shower.  Attach bath mats securely with double-sided, non-slip rug tape.  If you need to sit down while you are in the shower, use a plastic, non-slip stool.  Install grab bars by the toilet and in the tub and shower. Do not use towel bars as grab bars.      What can I do in the bedroom?  Make sure that a bedside light is easy to reach.  Do not use oversized bedding that drapes onto the floor.  Have a firm chair that has side arms to use for getting dressed. What can I do in the kitchen?  Clean up any spills right away.  If you need to reach for something above you, use a sturdy step stool that has a grab bar.  Keep electrical cables out of the way.  Do not use floor polish or wax that makes floors slippery. If you must use wax, make sure that it is non-skid floor wax. What can I do in the stairways?  Do not leave any items on the stairs.  Make sure that you have a light switch at the top of the stairs and the bottom of the stairs. Have them installed if you do not have them.  Make sure that there are handrails on both sides of the stairs. Fix handrails that are broken or loose. Make sure that handrails are as long as the stairways.  Install non-slip stair treads on all stairs in your home.  Avoid having throw rugs at the top or bottom of stairways, or secure the rugs with carpet tape to prevent them from moving.  Choose a carpet design that does not hide the edge of steps on the stairway.  Check any carpeting to make sure that it is firmly attached to the stairs. Fix any carpet that is loose or worn. What can I do on the outside of my home?  Use bright outdoor lighting.  Regularly  repair the edges of walkways and driveways and fix any cracks.  Remove high doorway thresholds.  Trim any shrubbery on the main  path into your home.  Regularly check that handrails are securely fastened and in good repair. Both sides of any steps should have handrails.  Install guardrails along the edges of any raised decks or porches.  Clear walkways of debris and clutter, including tools and rocks.  Have leaves, snow, and ice cleared regularly.  Use sand or salt on walkways during winter months.  In the garage, clean up any spills right away, including grease or oil spills. What other actions can I take?  Wear closed-toe shoes that fit well and support your feet. Wear shoes that have rubber soles or low heels.  Use mobility aids as needed, such as canes, walkers, scooters, and crutches.  Review your medicines with your health care provider. Some medicines can cause dizziness or changes in blood pressure, which increase your risk of falling. Talk with your health care provider about other ways that you can decrease your risk of falls. This may include working with a physical therapist or trainer to improve your strength, balance, and endurance. Where to find more information  Centers for Disease Control and Prevention, STEADI: WebmailGuide.co.za  [employer] on Aging: BrainJudge.co.uk Contact a health care provider if:  You are afraid of falling at home.  You feel weak, drowsy, or dizzy at home.  You fall at home. Summary  There are many simple things that you can do to make your home safe and to help prevent falls.  Ways to make your home safe include removing tripping hazards and installing grab bars in the bathroom.  Ask for help when making these changes in your home. This information is not intended to replace advice given to you by your health care provider. Make sure you discuss any questions you have with your health care provider. Document Revised: 03/10/2017 Document Reviewed: 11/10/2016 Elsevier Patient Education  2021 [employer].   If you have lab work done today  you will be contacted with your lab results within the next 2 weeks.  If you have not heard from [place] then please contact us. The fastest way to get your results is to register for My Chart.   IF you received an x-ray today, you will receive an invoice from [facility]. Please contact [facility] at [phone] with questions or concerns regarding your invoice.   IF you received labwork today, you will receive an invoice from [place]. Please contact [facility] at [phone] with questions or concerns regarding your invoice.   Our billing staff will not be able to assist you with questions regarding bills from these companies.  You will be contacted with the lab results as soon as they are available. The fastest way to get your results is to activate your My Chart account. Instructions are located on the last page of this paperwork. If you have not heard from [place] regarding the results in 2 weeks, please contact this office.         Signed, Merri Ray, MD [facility]

## 2020-04-22 ENCOUNTER — Telehealth: Payer: Self-pay | Admitting: Family Medicine

## 2020-04-22 NOTE — Telephone Encounter (Signed)
04/22/2020 - PATIENT'S DAUGHTER-N-LAW (SUSAN CALES) STATES WE TRIED TO CALL HER REGARDING HER  MOTHER-N-LAWS X-RAYS SHE HAD DONE ON 04/20/2020 ORDERED BY DR. Carlota Raspberry. SHE IS RETURNING OUR CALL. IT LOOKS LIKE IT WAS BRIAN WHO CALLED. SUSAN CALES IS ON THE PATIENT'S HIPAA FORM.  BEST PHONE FOR SUSAN CALES IS: [phone] (CELL) SHE IS  OFF TODAY SO THIS WOULD BE A GREAT TIME TO CALL HER BACK. [place]

## 2020-04-23 NOTE — Telephone Encounter (Signed)
Called Sheri Miller had to leave VM.   Xrays are clear just arthritis that was previously noted on other scans

## 2020-04-23 NOTE — Telephone Encounter (Signed)
Pt DIL informed recent Xray results

## 2020-04-27 ENCOUNTER — Other Ambulatory Visit: Payer: Self-pay | Admitting: Family Medicine

## 2020-04-27 DIAGNOSIS — F0391 Unspecified dementia with behavioral disturbance: Secondary | ICD-10-CM

## 2020-04-29 ENCOUNTER — Encounter: Payer: Self-pay | Admitting: Family Medicine

## 2020-04-29 ENCOUNTER — Ambulatory Visit (INDEPENDENT_AMBULATORY_CARE_PROVIDER_SITE_OTHER): Payer: Medicare HMO | Admitting: Family Medicine

## 2020-04-29 ENCOUNTER — Other Ambulatory Visit: Payer: Self-pay

## 2020-04-29 VITALS — BP 142/84 | HR 64 | Temp 97.6°F | Ht 61.0 in | Wt 124.0 lb

## 2020-04-29 DIAGNOSIS — M545 Low back pain, unspecified: Secondary | ICD-10-CM | POA: Diagnosis not present

## 2020-04-29 DIAGNOSIS — I10 Essential (primary) hypertension: Secondary | ICD-10-CM | POA: Diagnosis not present

## 2020-04-29 DIAGNOSIS — N189 Chronic kidney disease, unspecified: Secondary | ICD-10-CM

## 2020-04-29 DIAGNOSIS — R69 Illness, unspecified: Secondary | ICD-10-CM | POA: Diagnosis not present

## 2020-04-29 DIAGNOSIS — F0391 Unspecified dementia with behavioral disturbance: Secondary | ICD-10-CM

## 2020-04-29 DIAGNOSIS — E039 Hypothyroidism, unspecified: Secondary | ICD-10-CM

## 2020-04-29 MED ORDER — ESCITALOPRAM OXALATE 10 MG PO TABS: ORAL_TABLET | ORAL | 1 refills | Status: AC

## 2020-04-29 NOTE — Patient Instructions (Addendum)
  Tylenol as needed for back pain or change in mobility as that may be related to back pain.  If more difficulty with walking/ambulating or looks to be more unstable, follow-up and we can look into other causes.  Otherwise continue same medications, recheck in 6 months.  I will let you know if there are any concerns based on the thyroid test today.  Thank you for coming in today and let me know if there are questions.    If you have lab work done today you will be contacted with your lab results within the next 2 weeks.  If you have not heard from [place] then please contact us. The fastest way to get your results is to register for My Chart.   IF you received an x-ray today, you will receive an invoice from [facility]. Please contact [facility] at [phone] with questions or concerns regarding your invoice.   IF you received labwork today, you will receive an invoice from [place]. Please contact [facility] at [phone] with questions or concerns regarding your invoice.   Our billing staff will not be able to assist you with questions regarding bills from these companies.  You will be contacted with the lab results as soon as they are available. The fastest way to get your results is to activate your My Chart account. Instructions are located on the last page of this paperwork. If you have not heard from [place] regarding the results in 2 weeks, please contact this office.

## 2020-04-29 NOTE — Progress Notes (Signed)
Subjective:  Patient ID: Sheri Miller, female    DOB: 1923-11-25  Age: 87 y.o. MRN: [id number]  CC:  Chief Complaint  Patient presents with  . Follow-up    On hypertension, thyroid and fall. Pt  hasn't had any physical symptoms of hypertension. PT reports no physical issues with thyroid. PT has complained of a dull aching pain in her lower back since her fall.    HPI Sheri Miller presents for   Hypertension: Treated with amlodipine 2.5 mg daily, lisinopril 20 mg daily. No new side effects.  No home readings: Here with son.no complaints of cough, chest tightness and shortness of breath.   Cardiovascular: Negative for chest pain, palpitations and leg swelling.  Gastrointestinal: Negative for abdominal pain and blood in stool.  Neurological: Negative for dizziness, light-headedness and headaches.     BP Readings from Last 3 Encounters:  04/29/20 (!) 142/84  04/20/20 135/70  03/18/20 (!) 158/76   Lab Results  Component Value Date   CREATININE 1.54 (H) 03/18/2020    Hypothyroidism: Lab Results  Component Value Date   TSH 33.200 (H) 03/18/2020  Elevated TSH in December, medication was restarted at 50 mcg daily. Taking medication daily.  No new hot or cold intolerance. No new hair or skin changes, heart palpitations or new fatigue. No new weight changes.   Low back pain History of degenerative disc disease with degenerative spondylolisthesis.  Status post fall January 4, evaluated January 10, improving at that time.  X-ray without apparent changes Still c/o pain off and on in lower back. Wobbly at times, but no further falls. Back sore today, but not in past few days.  Tx: tylenol initially.   History Patient Active Problem List   Diagnosis Date Noted  . Dementia ([place]) 10/24/2018   Past Medical History:  Diagnosis Date  . Hyperlipidemia   . Hypertension    No past surgical history on file. No Known Allergies Prior to Admission medications   Medication Sig Start  Date End Date Taking? Authorizing Provider  amLODipine (NORVASC) 2.5 MG tablet Take 1 tablet (2.5 mg total) by mouth daily. 03/18/20  Yes Wendie Agreste, MD  divalproex (DEPAKOTE SPRINKLE) 125 MG capsule SMARTSIG:1 Capsule(s) By Mouth Every Evening PRN 03/16/20  Yes [provider]  escitalopram (LEXAPRO) 10 MG tablet TAKE 1 TABLET(10 MG) BY MOUTH AT BEDTIME 04/27/20  Yes Wendie Agreste, MD  latanoprost (XALATAN) 0.005 % ophthalmic solution INSTILL 1 DROP IN BOTH EYES AT BEDTIME 09/18/19  Yes Wendie Agreste, MD  levothyroxine (SYNTHROID) 50 MCG tablet Take 1 tablet (50 mcg total) by mouth daily before breakfast. 03/18/20  Yes Wendie Agreste, MD  lisinopril (ZESTRIL) 20 MG tablet Take 1 tablet (20 mg total) by mouth daily. 03/18/20  Yes Wendie Agreste, MD  Multiple Vitamins-Minerals (ULTRA WOMENS PACK PO) Take by mouth.   Yes [provider]  omega-3 acid ethyl esters (LOVAZA) 1 g capsule Take by mouth 2 (two) times daily.   Yes [provider]   Social History   Socioeconomic History  . Marital status: Single    Spouse name: Not on file  . Number of children: 4  . Years of education: Not on file  . Highest education level: Not on file  Occupational History  . Not on file  Tobacco Use  . Smoking status: Never Smoker  . Smokeless tobacco: Never Used  Vaping Use  . Vaping Use: Never used  Substance and Sexual Activity  . Alcohol  use: Never  . Drug use: Never  . Sexual activity: Not on file  Other Topics Concern  . Not on file  Social History Narrative   Pt lives with her son and his family (wife and 2 sons) in 2 story home   Has 4 children   8th grade education   Retired Regulatory affairs officer    Social Determinants of Radio broadcast assistant Strain: Not on [employer] Insecurity: Not on file  Transportation Needs: Not on file  Physical Activity: Not on file  Stress: Not on file  Social Connections: Not on file  Intimate Partner Violence: Not on  file    Review of Systems  Per HPI.  Objective:   Vitals:   04/29/20 1630 04/29/20 1633  BP: (!) 170/85 (!) 142/84  Pulse: 64   Temp: 97.6 F (36.4 C)   TempSrc: Temporal   SpO2: 98%   Weight: 124 lb (56.2 kg)   Height: 5\' 1"  (1.549 m)    Physical Exam Vitals reviewed.  Constitutional:      Appearance: She is well-developed and well-nourished.  HENT:     Head: Normocephalic and atraumatic.  Eyes:     Extraocular Movements: EOM normal.     Conjunctiva/sclera: Conjunctivae normal.     Pupils: Pupils are equal, round, and reactive to light.  Neck:     Vascular: No carotid bruit.  Cardiovascular:     Rate and Rhythm: Normal rate and regular rhythm.     Pulses: Intact distal pulses.     Heart sounds: Normal heart sounds.  Pulmonary:     Effort: Pulmonary effort is normal.     Breath sounds: Normal breath sounds.  Abdominal:     Palpations: Abdomen is soft. There is no pulsatile mass.     Tenderness: There is no abdominal tenderness.  Musculoskeletal:     Comments: Lumbar spine, no focal bony tenderness, no midline bony tenderness, negative seated straight leg raise.  Skin:    General: Skin is warm and dry.  Neurological:     Mental Status: She is alert and oriented to person, place, and time.  Psychiatric:        Mood and Affect: Mood and affect normal.        Behavior: Behavior normal.        Assessment & Plan:  Sheri Miller is a 87 y.o. female . Hypothyroidism, unspecified type  -Tolerating restart of Synthroid, check TSH, continue same regimen for now. Low back pain, unspecified back pain laterality, unspecified chronicity, unspecified whether sciatica present  Chronic kidney disease, unspecified CKD stage Essential hypertension - Plan: TSH Overall stable CKD with labs in December.  Blood pressure borderline, but based on age and risk for falls we will avoid higher dosing at this time.  Home monitoring recommended with RTC precautions.  64-month  follow-up.  Low back pain, chronic With degenerative disc disease.  Recent x-rays overall stable.  Option of Tylenol for pain or days when she appears to have change in mobility as that may be related to underlying back pain.  RTC precautions.  Dementia: Stable, under care of son at home.  Tolerating Lexapro without new concerns.  Continue same.  No orders of the defined types were placed in this encounter.  Patient Instructions    Tylenol as needed for back pain or change in mobility as that may be related to back pain.  If more difficulty with walking/ambulating or looks to be more unstable, follow-up and we  can look into other causes.  Otherwise continue same medications, recheck in 6 months.  I will let you know if there are any concerns based on the thyroid test today.  Thank you for coming in today and let me know if there are questions.    If you have lab work done today you will be contacted with your lab results within the next 2 weeks.  If you have not heard from [place] then please contact us. The fastest way to get your results is to register for My Chart.   IF you received an x-ray today, you will receive an invoice from [facility]. Please contact [facility] at [phone] with questions or concerns regarding your invoice.   IF you received labwork today, you will receive an invoice from [facility]. Please contact [facility] at [phone] with questions or concerns regarding your invoice.   Our billing staff will not be able to assist you with questions regarding bills from these companies.  You will be contacted with the lab results as soon as they are available. The fastest way to get your results is to activate your My Chart account. Instructions are located on the last page of this paperwork. If you have not heard from [place] regarding the results in 2 weeks, please contact this office.         Signed, Merri Ray, MD [facility]

## 2020-04-30 LAB — TSH: TSH: 6.55 u[IU]/mL — ABNORMAL HIGH (ref 0.450–4.500)

## 2020-05-04 ENCOUNTER — Encounter: Payer: Self-pay | Admitting: Radiology

## 2020-06-13 ENCOUNTER — Other Ambulatory Visit: Payer: Self-pay | Admitting: Neurology

## 2020-06-23 ENCOUNTER — Other Ambulatory Visit: Payer: Self-pay

## 2020-06-23 ENCOUNTER — Ambulatory Visit (INDEPENDENT_AMBULATORY_CARE_PROVIDER_SITE_OTHER): Payer: Medicare HMO | Admitting: Family Medicine

## 2020-06-23 ENCOUNTER — Encounter: Payer: Self-pay | Admitting: Family Medicine

## 2020-06-23 VITALS — BP 136/66 | HR 51 | Temp 97.3°F | Ht 61.0 in | Wt 122.0 lb

## 2020-06-23 DIAGNOSIS — R630 Anorexia: Secondary | ICD-10-CM

## 2020-06-23 NOTE — Patient Instructions (Addendum)
  Health Maintenance After Age 85 After age 85, you are at a higher risk for certain long-term diseases and infections as well as injuries from falls. Falls are a major cause of broken bones and head injuries in people who are older than age 85. Getting regular preventive care can help to keep you healthy and well. Preventive care includes getting regular testing and making lifestyle changes as recommended by your health care provider. Talk with your health care provider about:  Which screenings and tests you should have. A screening is a test that checks for a disease when you have no symptoms.  A diet and exercise plan that is right for you. What should I know about screenings and tests to prevent falls? Screening and testing are the best ways to find a health problem early. Early diagnosis and treatment give you the best chance of managing medical conditions that are common after age 85. Certain conditions and lifestyle choices may make you more likely to have a fall. Your health care provider may recommend:  Regular vision checks. Poor vision and conditions such as cataracts can make you more likely to have a fall. If you wear glasses, make sure to get your prescription updated if your vision changes.  Medicine review. Work with your health care provider to regularly review all of the medicines you are taking, including over-the-counter medicines. Ask your health care provider about any side effects that may make you more likely to have a fall. Tell your health care provider if any medicines that you take make you feel dizzy or sleepy.  Osteoporosis screening. Osteoporosis is a condition that causes the bones to get weaker. This can make the bones weak and cause them to break more easily.  Blood pressure screening. Blood pressure changes and medicines to control blood pressure can make you feel dizzy.  Strength and balance checks. Your health care provider may recommend certain tests to  check your strength and balance while standing, walking, or changing positions.  Foot health exam. Foot pain and numbness, as well as not wearing proper footwear, can make you more likely to have a fall.  Depression screening. You may be more likely to have a fall if you have a fear of falling, feel emotionally low, or feel unable to do activities that you used to do.  Alcohol use screening. Using too much alcohol can affect your balance and may make you more likely to have a fall. What actions can I take to lower my risk of falls? General instructions  Talk with your health care provider about your risks for falling. Tell your health care provider if: ? You fall. Be sure to tell your health care provider about all falls, even ones that seem minor. ? You feel dizzy, sleepy, or off-balance.  Take over-the-counter and prescription medicines only as told by your health care provider. These include any supplements.  Eat a healthy diet and maintain a healthy weight. A healthy diet includes low-fat dairy products, low-fat (lean) meats, and fiber from whole grains, beans, and lots of fruits and vegetables. Home safety  Remove any tripping hazards, such as rugs, cords, and clutter.  Install safety equipment such as grab bars in bathrooms and safety rails on stairs.  Keep rooms and walkways well-lit. Activity  Follow a regular exercise program to stay fit. This will help you maintain your balance. Ask your health care provider what types of exercise are appropriate for you.  If you need a cane   or walker, use it as recommended by your health care provider.  Wear supportive shoes that have nonskid soles.   Lifestyle  Do not drink alcohol if your health care provider tells you not to drink.  If you drink alcohol, limit how much you have: ? 0-1 drink a day for women. ? 0-2 drinks a day for men.  Be aware of how much alcohol is in your drink. In the [place] one drink equals one typical bottle  of beer (12 oz), one-half glass of wine (5 oz), or one shot of hard liquor (1 oz).  Do not use any products that contain nicotine or tobacco, such as cigarettes and e-cigarettes. If you need help quitting, ask your health care provider. Summary  Having a healthy lifestyle and getting preventive care can help to protect your health and wellness after age 85.  Screening and testing are the best way to find a health problem early and help you avoid having a fall. Early diagnosis and treatment give you the best chance for managing medical conditions that are more common for people who are older than age 85.  Falls are a major cause of broken bones and head injuries in people who are older than age 85. Take precautions to prevent a fall at home.  Work with your health care provider to learn what changes you can make to improve your health and wellness and to prevent falls. This information is not intended to replace advice given to you by your health care provider. Make sure you discuss any questions you have with your health care provider. Document Revised: 07/19/2018 Document Reviewed: 02/08/2017 Elsevier Patient Education  2021 Elsevier Inc.   If you have lab work done today you will be contacted with your lab results within the next 2 weeks.  If you have not heard from us then please contact us. The fastest way to get your results is to register for My Chart.   IF you received an x-ray today, you will receive an invoice from [facility]. Please contact  Radiology at [phone] with questions or concerns regarding your invoice.   IF you received labwork today, you will receive an invoice from [facility]. Please contact [facility] at [phone] with questions or concerns regarding your invoice.   Our billing staff will not be able to assist you with questions regarding bills from these companies.  You will be contacted with the lab results as soon as they are available.  The fastest way to get your results is to activate your My Chart account. Instructions are located on the last page of this paperwork. If you have not heard from us regarding the results in 2 weeks, please contact this office.      

## 2020-06-23 NOTE — Progress Notes (Addendum)
3/15/202210:01 AM  Sheri Miller [birth year], 87 y.o., female [id number]  Chief Complaint  Patient presents with  . decreased appetite    For about a week now- having to push food and drinks   . recent fall     HPI:   Patient is a 87 y.o. female with past medical history significant for dementia who presents today for recent fall and decreased appetite.  Due to dementia history obtained from son. Minimal verbal interaction from patient  Has had a decreased appetite for a week now [facility] yesterday and hit right hip Right hip has been hurting for the past year Didn't hit head when fell Has a history of severe dementia Always had dry skin on legs, feels like that is worse now Prior ate frequent small meals Questions use to vary Now repetitive questions Focuses on one simple topics Started living with son 3 years ago Now memory is approx 5 min   Depression screen [facility] 2/9 06/23/2020 04/29/2020 04/20/2020  Decreased Interest 0 0 0  Down, Depressed, Hopeless 0 0 1  PHQ - 2 Score 0 0 1    Fall Risk  06/23/2020 04/29/2020 04/29/2020 04/20/2020 03/18/2020  Falls in the past year? 1 1 0 1 0  Comment - - - - -  Number falls in past yr: 0 0 - 0 -  Comment - - - - -  Injury with Fall? 0 1 - 1 -  Comment - - - - -  Risk for fall due to : - - - - -  Follow up _0      No Known Allergies  Prior to Admission medications   Medication Sig Start Date End Date Taking? Authorizing Provider  amLODipine (NORVASC) 2.5 MG tablet Take 1 tablet (2.5 mg total) by mouth daily. 03/18/20  Yes Wendie Agreste, MD  diphenhydramine-acetaminophen (TYLENOL PM) 25-500 MG TABS tablet Take 1 tablet by mouth at bedtime as needed.   Yes [provider]  escitalopram (LEXAPRO) 10 MG tablet TAKE 1 TABLET(10 MG) BY MOUTH AT BEDTIME 04/29/20  Yes Wendie Agreste, MD   latanoprost (XALATAN) 0.005 % ophthalmic solution INSTILL 1 DROP IN BOTH EYES AT BEDTIME 09/18/19  Yes Wendie Agreste, MD  levothyroxine (SYNTHROID) 50 MCG tablet Take 1 tablet (50 mcg total) by mouth daily before breakfast. 03/18/20  Yes Wendie Agreste, MD  lisinopril (ZESTRIL) 20 MG tablet Take 1 tablet (20 mg total) by mouth daily. 03/18/20  Yes Wendie Agreste, MD  Multiple Vitamins-Minerals (ULTRA WOMENS PACK PO) Take by mouth.   Yes [provider]  omega-3 acid ethyl esters (LOVAZA) 1 g capsule Take by mouth 2 (two) times daily.   Yes [provider]  divalproex (DEPAKOTE SPRINKLE) 125 MG capsule TAKE 1 CAPSULE BY MOUTH IN THE EVENING AS NEEDED FOR AGITATION Patient not taking: Reported on 06/23/2020 06/15/20   [name], MD    Past Medical History:  Diagnosis Date  . Hyperlipidemia   . Hypertension     History reviewed. No pertinent surgical history.  Social History   Tobacco Use  . Smoking status: Never Smoker  . Smokeless tobacco: Never Used  Substance Use Topics  . Alcohol use: Never    Family History  Problem Relation Age of Onset  . Dementia Brother     Review of Systems  Constitutional: Negative for chills, fever and malaise/fatigue.  Gastrointestinal:  Lack of appetite, unsure last BM  Musculoskeletal: Positive for joint pain (right hip pain). Negative for falls.  Psychiatric/Behavioral: Positive for memory loss.     OBJECTIVE:  Today's Vitals   06/23/20 0925  BP: 136/66  Pulse: (!) 51  Temp: (!) 97.3 F (36.3 C)  SpO2: 96%  Weight: 122 lb (55.3 kg)  Height: _0  (1.549 m)   Body mass index is 23.05 kg/m.   Physical Exam Constitutional:      General: She is not in acute distress.    Appearance: Normal appearance. She is not ill-appearing.  HENT:     Head: Normocephalic.  Cardiovascular:     Rate and Rhythm: Normal rate and regular rhythm.     Pulses: Normal pulses.     Heart sounds: Normal heart sounds.  No murmur heard. No friction rub. No gallop.   Pulmonary:     Effort: Pulmonary effort is normal. No respiratory distress.     Breath sounds: Normal breath sounds. No stridor. No wheezing, rhonchi or rales.  Abdominal:     General: Bowel sounds are normal.     Palpations: Abdomen is soft.     Tenderness: There is no abdominal tenderness.  Musculoskeletal:     Right lower leg: No edema.     Left lower leg: No edema.  Skin:    General: Skin is warm and dry.  Neurological:     Mental Status: She is alert. Mental status is at baseline. She is disoriented.     Gait: Gait normal.  Psychiatric:        Mood and Affect: Mood normal.        Behavior: Behavior normal.     No results found for this or any previous visit (from the past 24 hour(s)).  No results found.   ASSESSMENT and PLAN  Problem List Items Addressed This Visit   None   Visit Diagnoses    Appetite loss    -  Primary   Relevant Orders   CBC with Differential   CMP14+EGFR   TSH   Vitamin D, 25-hydroxy   Vitamin B12   Thyroid Profile   Urine Culture       Plan . Will follow up with lab results . Discussed medication options to increase appetite . RTC/ED precautions provided . Had a long conversation with the patients son about the natural progression of severe dementia.  . May benefit from a referral to palliative if labs come back normal.   Return if symptoms worsen or fail to improve.    Huston Foley Nicholaos Schippers, FNP-BC [facility], [place] [address] Ph.  [phone] Fax [phone]

## 2020-06-24 ENCOUNTER — Other Ambulatory Visit: Payer: Self-pay

## 2020-06-24 ENCOUNTER — Ambulatory Visit: Payer: Medicare HMO | Admitting: Family Medicine

## 2020-06-24 ENCOUNTER — Other Ambulatory Visit: Payer: Self-pay | Admitting: Family Medicine

## 2020-06-24 ENCOUNTER — Encounter (HOSPITAL_COMMUNITY): Payer: Self-pay | Admitting: Internal Medicine

## 2020-06-24 ENCOUNTER — Emergency Department (HOSPITAL_COMMUNITY): Payer: Medicare HMO

## 2020-06-24 ENCOUNTER — Inpatient Hospital Stay (HOSPITAL_COMMUNITY)
Admission: EM | Admit: 2020-06-24 | Discharge: 2020-06-26 | DRG: 951 | Disposition: A | Discharge status: Hospice | Payer: Medicare HMO | Attending: Internal Medicine | Admitting: Internal Medicine

## 2020-06-24 DIAGNOSIS — C680 Malignant neoplasm of urethra: Secondary | ICD-10-CM | POA: Diagnosis not present

## 2020-06-24 DIAGNOSIS — E785 Hyperlipidemia, unspecified: Secondary | ICD-10-CM | POA: Diagnosis present

## 2020-06-24 DIAGNOSIS — C50911 Malignant neoplasm of unspecified site of right female breast: Secondary | ICD-10-CM | POA: Diagnosis present

## 2020-06-24 DIAGNOSIS — C689 Malignant neoplasm of urinary organ, unspecified: Secondary | ICD-10-CM | POA: Diagnosis not present

## 2020-06-24 DIAGNOSIS — E039 Hypothyroidism, unspecified: Secondary | ICD-10-CM | POA: Diagnosis present

## 2020-06-24 DIAGNOSIS — Z515 Encounter for palliative care: Secondary | ICD-10-CM | POA: Diagnosis not present

## 2020-06-24 DIAGNOSIS — I2510 Atherosclerotic heart disease of native coronary artery without angina pectoris: Secondary | ICD-10-CM | POA: Diagnosis present

## 2020-06-24 DIAGNOSIS — R001 Bradycardia, unspecified: Secondary | ICD-10-CM | POA: Diagnosis not present

## 2020-06-24 DIAGNOSIS — K8080 Other cholelithiasis without obstruction: Secondary | ICD-10-CM | POA: Diagnosis not present

## 2020-06-24 DIAGNOSIS — H409 Unspecified glaucoma: Secondary | ICD-10-CM | POA: Diagnosis present

## 2020-06-24 DIAGNOSIS — N179 Acute kidney failure, unspecified: Secondary | ICD-10-CM | POA: Diagnosis not present

## 2020-06-24 DIAGNOSIS — E222 Syndrome of inappropriate secretion of antidiuretic hormone: Secondary | ICD-10-CM | POA: Diagnosis present

## 2020-06-24 DIAGNOSIS — Z66 Do not resuscitate: Secondary | ICD-10-CM | POA: Diagnosis present

## 2020-06-24 DIAGNOSIS — N183 Chronic kidney disease, stage 3 unspecified: Secondary | ICD-10-CM | POA: Diagnosis present

## 2020-06-24 DIAGNOSIS — N6310 Unspecified lump in the right breast, unspecified quadrant: Secondary | ICD-10-CM | POA: Diagnosis not present

## 2020-06-24 DIAGNOSIS — Z79899 Other long term (current) drug therapy: Secondary | ICD-10-CM | POA: Diagnosis not present

## 2020-06-24 DIAGNOSIS — E875 Hyperkalemia: Secondary | ICD-10-CM | POA: Diagnosis present

## 2020-06-24 DIAGNOSIS — N281 Cyst of kidney, acquired: Secondary | ICD-10-CM | POA: Diagnosis not present

## 2020-06-24 DIAGNOSIS — R531 Weakness: Secondary | ICD-10-CM | POA: Diagnosis not present

## 2020-06-24 DIAGNOSIS — R7401 Elevation of levels of liver transaminase levels: Secondary | ICD-10-CM

## 2020-06-24 DIAGNOSIS — I10 Essential (primary) hypertension: Secondary | ICD-10-CM | POA: Diagnosis not present

## 2020-06-24 DIAGNOSIS — K449 Diaphragmatic hernia without obstruction or gangrene: Secondary | ICD-10-CM | POA: Diagnosis not present

## 2020-06-24 DIAGNOSIS — H3530 Unspecified macular degeneration: Secondary | ICD-10-CM | POA: Diagnosis present

## 2020-06-24 DIAGNOSIS — C799 Secondary malignant neoplasm of unspecified site: Secondary | ICD-10-CM | POA: Diagnosis present

## 2020-06-24 DIAGNOSIS — R160 Hepatomegaly, not elsewhere classified: Secondary | ICD-10-CM

## 2020-06-24 DIAGNOSIS — E860 Dehydration: Secondary | ICD-10-CM | POA: Diagnosis present

## 2020-06-24 DIAGNOSIS — F0391 Unspecified dementia with behavioral disturbance: Secondary | ICD-10-CM | POA: Diagnosis present

## 2020-06-24 DIAGNOSIS — C787 Secondary malignant neoplasm of liver and intrahepatic bile duct: Secondary | ICD-10-CM | POA: Diagnosis present

## 2020-06-24 DIAGNOSIS — W07XXXA Fall from chair, initial encounter: Secondary | ICD-10-CM | POA: Diagnosis not present

## 2020-06-24 DIAGNOSIS — Z7189 Other specified counseling: Secondary | ICD-10-CM | POA: Diagnosis not present

## 2020-06-24 DIAGNOSIS — K8020 Calculus of gallbladder without cholecystitis without obstruction: Secondary | ICD-10-CM | POA: Diagnosis not present

## 2020-06-24 DIAGNOSIS — K7689 Other specified diseases of liver: Secondary | ICD-10-CM | POA: Diagnosis not present

## 2020-06-24 DIAGNOSIS — F0390 Unspecified dementia without behavioral disturbance: Secondary | ICD-10-CM | POA: Diagnosis present

## 2020-06-24 DIAGNOSIS — E872 Acidosis: Secondary | ICD-10-CM | POA: Diagnosis present

## 2020-06-24 DIAGNOSIS — R4182 Altered mental status, unspecified: Secondary | ICD-10-CM | POA: Diagnosis not present

## 2020-06-24 DIAGNOSIS — I129 Hypertensive chronic kidney disease with stage 1 through stage 4 chronic kidney disease, or unspecified chronic kidney disease: Secondary | ICD-10-CM | POA: Diagnosis present

## 2020-06-24 DIAGNOSIS — H548 Legal blindness, as defined in USA: Secondary | ICD-10-CM | POA: Diagnosis present

## 2020-06-24 DIAGNOSIS — S2243XD Multiple fractures of ribs, bilateral, subsequent encounter for fracture with routine healing: Secondary | ICD-10-CM | POA: Diagnosis not present

## 2020-06-24 DIAGNOSIS — R69 Illness, unspecified: Secondary | ICD-10-CM | POA: Diagnosis not present

## 2020-06-24 DIAGNOSIS — Z79890 Hormone replacement therapy (postmenopausal): Secondary | ICD-10-CM | POA: Diagnosis not present

## 2020-06-24 DIAGNOSIS — I517 Cardiomegaly: Secondary | ICD-10-CM | POA: Diagnosis not present

## 2020-06-24 DIAGNOSIS — Z20822 Contact with and (suspected) exposure to covid-19: Secondary | ICD-10-CM | POA: Diagnosis not present

## 2020-06-24 DIAGNOSIS — R627 Adult failure to thrive: Secondary | ICD-10-CM | POA: Diagnosis present

## 2020-06-24 HISTORY — DX: Unspecified glaucoma: H40.9

## 2020-06-24 HISTORY — DX: Unspecified macular degeneration: H35.30

## 2020-06-24 HISTORY — DX: Unspecified visual loss: H54.7

## 2020-06-24 HISTORY — DX: Unspecified dementia, unspecified severity, without behavioral disturbance, psychotic disturbance, mood disturbance, and anxiety: F03.90

## 2020-06-24 LAB — COMPREHENSIVE METABOLIC PANEL
ALT: 64 U/L — ABNORMAL HIGH (ref 0–44)
AST: 139 U/L — ABNORMAL HIGH (ref 15–41)
Albumin: 3 g/dL — ABNORMAL LOW (ref 3.5–5.0)
Alkaline Phosphatase: 287 U/L — ABNORMAL HIGH (ref 38–126)
Anion gap: 9 (ref 5–15)
BUN: 36 mg/dL — ABNORMAL HIGH (ref 8–23)
CO2: 21 mmol/L — ABNORMAL LOW (ref 22–32)
Calcium: 9 mg/dL (ref 8.9–10.3)
Chloride: 95 mmol/L — ABNORMAL LOW (ref 98–111)
Creatinine, Ser: 1.68 mg/dL — ABNORMAL HIGH (ref 0.44–1.00)
GFR, Estimated: 28 mL/min — ABNORMAL LOW (ref >60)
Glucose, Bld: 95 mg/dL (ref 70–99)
Potassium: 5.4 mmol/L — ABNORMAL HIGH (ref 3.5–5.1)
Sodium: 125 mmol/L — ABNORMAL LOW (ref 135–145)
Total Bilirubin: 1 mg/dL (ref 0.3–1.2)
Total Protein: 6.9 g/dL (ref 6.5–8.1)

## 2020-06-24 LAB — CBC WITH DIFFERENTIAL/PLATELET
Abs Immature Granulocytes: 0.01 10*3/uL (ref 0.00–0.07)
Basophils Absolute: 0.1 10*3/uL (ref 0.0–0.2)
Basophils Absolute: 0.1 10*3/uL (ref 0.0–0.1)
Basophils Relative: 2 %
Basos: 2 %
EOS (ABSOLUTE): 0.3 10*3/uL (ref 0.0–0.4)
Eos: 3 %
Eosinophils Absolute: 0.2 10*3/uL (ref 0.0–0.5)
Eosinophils Relative: 3 %
HCT: 39.3 % (ref 36.0–46.0)
Hematocrit: 41.3 % (ref 34.0–46.6)
Hemoglobin: 13.5 g/dL (ref 11.1–15.9)
Hemoglobin: 12.9 g/dL (ref 12.0–15.0)
Immature Grans (Abs): 0 10*3/uL (ref 0.0–0.1)
Immature Granulocytes: 0 %
Immature Granulocytes: 0 %
Lymphocytes Absolute: 1.8 10*3/uL (ref 0.7–3.1)
Lymphocytes Relative: 18 %
Lymphs Abs: 1.1 10*3/uL (ref 0.7–4.0)
Lymphs: 23 %
MCH: 27.9 pg (ref 26.6–33.0)
MCH: 28.2 pg (ref 26.0–34.0)
MCHC: 32.7 g/dL (ref 31.5–35.7)
MCHC: 32.8 g/dL (ref 30.0–36.0)
MCV: 85 fL (ref 79–97)
MCV: 86 fL (ref 80.0–100.0)
Monocytes Absolute: 0.9 10*3/uL (ref 0.1–0.9)
Monocytes Absolute: 0.7 10*3/uL (ref 0.1–1.0)
Monocytes Relative: 12 %
Monocytes: 12 %
Neutro Abs: 3.9 10*3/uL (ref 1.7–7.7)
Neutrophils Absolute: 4.9 10*3/uL (ref 1.4–7.0)
Neutrophils Relative %: 65 %
Neutrophils: 60 %
Platelets: 424 10*3/uL (ref 150–450)
Platelets: 359 10*3/uL (ref 150–400)
RBC: 4.84 x10E6/uL (ref 3.77–5.28)
RBC: 4.57 MIL/uL (ref 3.87–5.11)
RDW: 13.1 % (ref 11.7–15.4)
RDW: 14.6 % (ref 11.5–15.5)
WBC: 8 10*3/uL (ref 3.4–10.8)
WBC: 6 10*3/uL (ref 4.0–10.5)
nRBC: 0 % (ref 0.0–0.2)

## 2020-06-24 LAB — URINALYSIS, ROUTINE W REFLEX MICROSCOPIC
Bilirubin Urine: NEGATIVE
Glucose, UA: NEGATIVE mg/dL
Ketones, ur: NEGATIVE mg/dL
Leukocytes,Ua: NEGATIVE
Nitrite: NEGATIVE
Protein, ur: NEGATIVE mg/dL
RBC / HPF: >50 RBC/hpf — ABNORMAL HIGH (ref 0–5)
Specific Gravity, Urine: 1.006 (ref 1.005–1.030)
pH: 5 (ref 5.0–8.0)

## 2020-06-24 LAB — CMP14+EGFR
ALT: 75 IU/L — ABNORMAL HIGH (ref 0–32)
AST: 176 IU/L — ABNORMAL HIGH (ref 0–40)
Albumin/Globulin Ratio: 1 — ABNORMAL LOW (ref 1.2–2.2)
Albumin: 3.9 g/dL (ref 3.5–4.6)
Alkaline Phosphatase: 393 IU/L — ABNORMAL HIGH (ref 44–121)
BUN/Creatinine Ratio: 17 (ref 12–28)
BUN: 31 mg/dL (ref 10–36)
Bilirubin Total: 0.8 mg/dL (ref 0.0–1.2)
CO2: 19 mmol/L — ABNORMAL LOW (ref 20–29)
Calcium: 9.7 mg/dL (ref 8.7–10.3)
Chloride: 92 mmol/L — ABNORMAL LOW (ref 96–106)
Creatinine, Ser: 1.86 mg/dL — ABNORMAL HIGH (ref 0.57–1.00)
Globulin, Total: 3.8 g/dL (ref 1.5–4.5)
Glucose: 85 mg/dL (ref 65–99)
Potassium: 6.2 mmol/L — ABNORMAL HIGH (ref 3.5–5.2)
Sodium: 128 mmol/L — ABNORMAL LOW (ref 134–144)
Total Protein: 7.7 g/dL (ref 6.0–8.5)
eGFR: 25 mL/min/{1.73_m2} — ABNORMAL LOW (ref >59)

## 2020-06-24 LAB — OSMOLALITY, URINE: Osmolality, Ur: 275 mOsm/kg — ABNORMAL LOW (ref 300–900)

## 2020-06-24 LAB — TSH: TSH: 6.86 u[IU]/mL — ABNORMAL HIGH (ref 0.450–4.500)

## 2020-06-24 LAB — RESP PANEL BY RT-PCR (FLU A&B, COVID) ARPGX2
Influenza A by PCR: NEGATIVE
Influenza B by PCR: NEGATIVE
SARS Coronavirus 2 by RT PCR: NEGATIVE

## 2020-06-24 LAB — MAGNESIUM: Magnesium: 2.1 mg/dL (ref 1.7–2.4)

## 2020-06-24 LAB — SODIUM, URINE, RANDOM: Sodium, Ur: 64 mmol/L

## 2020-06-24 LAB — VITAMIN D 25 HYDROXY (VIT D DEFICIENCY, FRACTURES): Vit D, 25-Hydroxy: 47.8 ng/mL (ref 30.0–100.0)

## 2020-06-24 LAB — LIPASE, BLOOD: Lipase: 44 U/L (ref 11–51)

## 2020-06-24 LAB — TROPONIN I (HIGH SENSITIVITY): Troponin I (High Sensitivity): 11 ng/L (ref <18)

## 2020-06-24 LAB — LACTIC ACID, PLASMA: Lactic Acid, Venous: 1.4 mmol/L (ref 0.5–1.9)

## 2020-06-24 LAB — THYROID PANEL
Free Thyroxine Index: 1.5 (ref 1.2–4.9)
T3 Uptake Ratio: 23 % — ABNORMAL LOW (ref 24–39)
T4, Total: 6.4 ug/dL (ref 4.5–12.0)

## 2020-06-24 LAB — PHOSPHORUS: Phosphorus: 3.5 mg/dL (ref 2.5–4.6)

## 2020-06-24 LAB — VITAMIN B12: Vitamin B-12: >2000 pg/mL — ABNORMAL HIGH (ref 232–1245)

## 2020-06-24 LAB — AMMONIA: Ammonia: 26 umol/L (ref 9–35)

## 2020-06-24 MED ORDER — ESCITALOPRAM OXALATE 10 MG PO TABS
10.0000 mg | ORAL_TABLET | Freq: Every day | ORAL | Status: DC
  Administered 2020-06-24 – 2020-06-25 (×2): 10 mg via ORAL
  Filled 2020-06-24 (×2): qty 1

## 2020-06-24 MED ORDER — LEVOTHYROXINE SODIUM 50 MCG PO TABS
75.0000 ug | ORAL_TABLET | Freq: Every day | ORAL | Status: DC
  Administered 2020-06-26: 75 ug via ORAL
  Filled 2020-06-24: qty 1

## 2020-06-24 MED ORDER — HALOPERIDOL LACTATE 5 MG/ML IJ SOLN
5.0000 mg | Freq: Once | INTRAMUSCULAR | Status: AC
  Administered 2020-06-24: 5 mg via INTRAVENOUS
  Filled 2020-06-24: qty 1

## 2020-06-24 MED ORDER — HEPARIN SODIUM (PORCINE) 5000 UNIT/ML IJ SOLN
5000.0000 [IU] | Freq: Three times a day (TID) | INTRAMUSCULAR | Status: DC
  Administered 2020-06-24 – 2020-06-26 (×4): 5000 [IU] via SUBCUTANEOUS
  Filled 2020-06-24 (×3): qty 1

## 2020-06-24 MED ORDER — ADULT MULTIVITAMIN W/MINERALS CH
1.0000 | ORAL_TABLET | Freq: Every day | ORAL | Status: DC
  Administered 2020-06-25 – 2020-06-26 (×2): 1 via ORAL
  Filled 2020-06-24 (×2): qty 1

## 2020-06-24 MED ORDER — HYDRALAZINE HCL 10 MG PO TABS
10.0000 mg | ORAL_TABLET | Freq: Four times a day (QID) | ORAL | Status: DC | PRN
Start: 2020-06-24 — End: 2020-06-26

## 2020-06-24 MED ORDER — ACETAMINOPHEN 500 MG PO TABS
500.0000 mg | ORAL_TABLET | Freq: Every day | ORAL | Status: DC
  Administered 2020-06-24 – 2020-06-25 (×2): 500 mg via ORAL
  Filled 2020-06-24 (×2): qty 1

## 2020-06-24 MED ORDER — LACTATED RINGERS IV SOLN: INTRAVENOUS | Status: DC

## 2020-06-24 MED ORDER — THIAMINE HCL 100 MG PO TABS
100.0000 mg | ORAL_TABLET | Freq: Every day | ORAL | Status: DC
  Administered 2020-06-25 – 2020-06-26 (×2): 100 mg via ORAL
  Filled 2020-06-24 (×2): qty 1

## 2020-06-24 MED ORDER — OMEGA-3-ACID ETHYL ESTERS 1 G PO CAPS
1.0000 g | ORAL_CAPSULE | Freq: Every day | ORAL | Status: DC
  Administered 2020-06-25 – 2020-06-26 (×2): 1 g via ORAL
  Filled 2020-06-24 (×2): qty 1

## 2020-06-24 MED ORDER — PANTOPRAZOLE SODIUM 40 MG IV SOLR
40.0000 mg | INTRAVENOUS | Status: DC
  Administered 2020-06-24: 40 mg via INTRAVENOUS
  Filled 2020-06-24: qty 40

## 2020-06-24 MED ORDER — FOLIC ACID 1 MG PO TABS
1.0000 mg | ORAL_TABLET | Freq: Every day | ORAL | Status: DC
  Administered 2020-06-25 – 2020-06-26 (×2): 1 mg via ORAL
  Filled 2020-06-24 (×2): qty 1

## 2020-06-24 MED ORDER — ACETAMINOPHEN 325 MG PO TABS: 650.0000 mg | ORAL_TABLET | Freq: Four times a day (QID) | ORAL | Status: DC | PRN

## 2020-06-24 MED ORDER — LEVOTHYROXINE SODIUM 75 MCG PO TABS: 75.0000 ug | ORAL_TABLET | Freq: Every day | ORAL | 3 refills | Status: AC

## 2020-06-24 MED ORDER — SODIUM CHLORIDE 0.9 % IV BOLUS
250.0000 mL | Freq: Once | INTRAVENOUS | Status: AC
  Administered 2020-06-24: 250 mL via INTRAVENOUS

## 2020-06-24 MED ORDER — ULTRA WOMENS PACK PO MISC: Freq: Every day | ORAL | Status: DC

## 2020-06-24 MED ORDER — DIPHENHYDRAMINE HCL 25 MG PO CAPS
25.0000 mg | ORAL_CAPSULE | Freq: Every day | ORAL | Status: DC
  Administered 2020-06-24 – 2020-06-25 (×2): 25 mg via ORAL
  Filled 2020-06-24 (×2): qty 1

## 2020-06-24 MED ORDER — ONDANSETRON HCL 4 MG PO TABS: 4.0000 mg | ORAL_TABLET | Freq: Four times a day (QID) | ORAL | Status: DC | PRN

## 2020-06-24 MED ORDER — ONDANSETRON HCL 4 MG/2ML IJ SOLN: 4.0000 mg | Freq: Four times a day (QID) | INTRAMUSCULAR | Status: DC | PRN

## 2020-06-24 MED ORDER — LATANOPROST 0.005 % OP SOLN
1.0000 [drp] | Freq: Every day | OPHTHALMIC | Status: DC
  Administered 2020-06-24 – 2020-06-25 (×2): 1 [drp] via OPHTHALMIC
  Filled 2020-06-24: qty 2.5

## 2020-06-24 MED ORDER — DIPHENHYDRAMINE-APAP (SLEEP) 25-500 MG PO TABS: 1.0000 | ORAL_TABLET | Freq: Every day | ORAL | Status: DC

## 2020-06-24 MED ORDER — ACETAMINOPHEN 650 MG RE SUPP: 650.0000 mg | Freq: Four times a day (QID) | RECTAL | Status: DC | PRN

## 2020-06-24 NOTE — ED Triage Notes (Signed)
Pt presents to ED from home. Hx dementia. Family concerned for reduced appetite x1 week, took pt to dr yesterday. Results called to son this am indicated elevated kidney, liver markers and potassium. Dr told son to bring pt to ED. [place] [address] out of chair onto right hip yesterday, unwitnessed. UTA urinary s/sx. Alert to self at baseline.

## 2020-06-24 NOTE — ED Provider Notes (Signed)
[facility] DEPT Provider Note   CSN: [id number] Arrival date & time: 06/24/20  2376     History Chief Complaint  Patient presents with  . Fall  . abnormal labs    Sheri Miller is a 87 y.o. female with past medical history of dementia, hypertension, hyperlipidemia, hypothyroid, who presents today for evaluation of lab abnormalities. History is obtained from patient, her son, and chart review. About a week ago patient started having poor appetite.  She denies nausea however her family is having to encourage her to eat which is abnormal for her. A day or 2 before this started when they went to the grocery store patient's son noted that she got very fatigued from this. Patient has not had any fevers.  Unknown last bowel movement. She went to PCP yesterday and labs were drawn showing concern for creatinine slightly elevated above baseline, hyperkalemia at 6.2, alk phos elevated at 393, and transaminitis with AST and ALT of 176 and 75 respectively. Additionally her TSH was elevated at 6.68.   Patient denies any cough or shortness of breath.  She has had 3 Covid vaccines and is boosted.  She denies any known Covid exposures. Son reports that they are very careful when going in public wearing masks and using sanitizer.  No one at home is sick.  Yesterday patient was seated in her chair and had a witnessed fall where she reportedly fell asleep and fell out of the chair onto her right hip.  Per family reports patient did not strike her head or pass out.  She has been ambulatory and weightbearing since.  Patient states that her hip feels "better now."  Patient son reports that he feels like patient's mental status is currently at her baseline. Patient does not take any blood thinning medications HPI     Past Medical History:  Diagnosis Date  . Hyperlipidemia   . Hypertension     Patient Active Problem List   Diagnosis Date Noted  . Dementia ([place]) 10/24/2018     No past surgical history on file.   OB History   No obstetric history on file.     Family History  Problem Relation Age of Onset  . Dementia Brother     Social History   Tobacco Use  . Smoking status: Never Smoker  . Smokeless tobacco: Never Used  Vaping Use  . Vaping Use: Never used  Substance Use Topics  . Alcohol use: Never  . Drug use: Never    Home Medications Prior to Admission medications   Medication Sig Start Date End Date Taking? Authorizing Provider  amLODipine (NORVASC) 2.5 MG tablet Take 1 tablet (2.5 mg total) by mouth daily. 03/18/20  Yes Wendie Agreste, MD  diphenhydramine-acetaminophen (TYLENOL PM) 25-500 MG TABS tablet Take 1 tablet by mouth at bedtime.   Yes [provider]  escitalopram (LEXAPRO) 10 MG tablet TAKE 1 TABLET(10 MG) BY MOUTH AT BEDTIME Patient taking differently: Take 10 mg by mouth daily. 04/29/20  Yes Wendie Agreste, MD  latanoprost (XALATAN) 0.005 % ophthalmic solution INSTILL 1 DROP IN BOTH EYES AT BEDTIME Patient taking differently: Place 1 drop into both eyes at bedtime. 09/18/19  Yes Wendie Agreste, MD  levothyroxine (SYNTHROID) 75 MCG tablet Take 1 tablet (75 mcg total) by mouth daily before breakfast. 06/24/20  Yes Just, Laurita Quint, FNP  lisinopril (ZESTRIL) 20 MG tablet Take 1 tablet (20 mg total) by mouth daily. 03/18/20  Yes Wendie Agreste,  MD  Multiple Vitamins-Minerals (ULTRA WOMENS PACK PO) Take 1 tablet by mouth daily.   Yes [provider]  omega-3 acid ethyl esters (LOVAZA) 1 g capsule Take 1 g by mouth daily.   Yes [provider]  divalproex (DEPAKOTE SPRINKLE) 125 MG capsule TAKE 1 CAPSULE BY MOUTH IN THE EVENING AS NEEDED FOR AGITATION Patient not taking: No sig reported 06/15/20   [name], MD    Allergies    Patient has no known allergies.  Review of Systems   Review of Systems  Constitutional: Positive for appetite change and fatigue. Negative for chills and fever.   HENT: Negative for congestion.   Respiratory: Negative for cough, chest tightness and shortness of breath.   Cardiovascular: Negative for chest pain.  Gastrointestinal: Negative for abdominal pain, diarrhea, nausea and vomiting.  Genitourinary: Negative for dysuria.  Musculoskeletal: Negative for back pain and neck pain.       Right hip pain and fall  Skin: Negative for color change, rash and wound.  Neurological: Negative for weakness and headaches.  Psychiatric/Behavioral: Negative for confusion.  All other systems reviewed and are negative.   Physical Exam Updated Vital Signs BP (!) 138/50   Pulse (!) 43   Temp 97.6 F (36.4 C) (Oral)   Resp 18   Ht $R'5\' 1"'zp$  (1.549 m)   Wt 55.3 kg   SpO2 98%   BMI 23.05 kg/m   Physical Exam Vitals and nursing note reviewed.  Constitutional:      General: She is not in acute distress.    Appearance: She is not diaphoretic.     Comments: Elderly, frail  HENT:     Head: Normocephalic and atraumatic.     Comments: No contusions, abrasions, laceration, raccoon's eyes, battle signs or other evidence of trauma. Eyes:     General: No scleral icterus.       Right eye: No discharge.        Left eye: No discharge.     Conjunctiva/sclera: Conjunctivae normal.  Neck:     Comments: No midline tenderness palpation, step-offs, or deformities.  Full active range of motion without pain. Cardiovascular:     Rate and Rhythm: Normal rate and regular rhythm.     Heart sounds: Murmur heard.    Pulmonary:     Effort: Pulmonary effort is normal. No respiratory distress.     Breath sounds: Normal breath sounds. No stridor.  Abdominal:     General: Bowel sounds are normal. There is no distension.     Palpations: Abdomen is soft.     Tenderness: There is abdominal tenderness (Epigastric). There is no guarding or rebound.  Musculoskeletal:        General: Tenderness: Right lower extremity.     Cervical back: Normal range of motion.     Right lower leg:  No edema.     Left lower leg: No edema.     Comments: Bilateral arms and legs palpated without obvious crepitus or deformities.  Pelvis is grossly stable to compression without pain.  I am able to range patient's bilateral knees and hips without significant pain, crepitus or abnormality.  Skin:    General: Skin is warm and dry.  Neurological:     Mental Status: She is alert. Mental status is at baseline.     Motor: No weakness or abnormal muscle tone.     Comments: Patient is at work alert and oriented to person and place.  When I asked her what  year it is she says "it is 20 something."  Psychiatric:        Mood and Affect: Mood normal.        Behavior: Behavior normal.     ED Results / Procedures / Treatments   Labs (all labs ordered are listed, but only abnormal results are displayed) Labs Reviewed  COMPREHENSIVE METABOLIC PANEL - Abnormal; Notable for the following components:      Result Value   Sodium 125 (*)    Potassium 5.4 (*)    Chloride 95 (*)    CO2 21 (*)    BUN 36 (*)    Creatinine, Ser 1.68 (*)    Albumin 3.0 (*)    AST 139 (*)    ALT 64 (*)    Alkaline Phosphatase 287 (*)    GFR, Estimated 28 (*)    All other components within normal limits  URINALYSIS, ROUTINE W REFLEX MICROSCOPIC - Abnormal; Notable for the following components:   APPearance HAZY (*)    Hgb urine dipstick LARGE (*)    RBC / HPF >50 (*)    Bacteria, UA RARE (*)    All other components within normal limits  RESP PANEL BY RT-PCR (FLU A&B, COVID) [id number]  URINE CULTURE  CULTURE, BLOOD (ROUTINE X 2)  CBC WITH DIFFERENTIAL/PLATELET  LIPASE, BLOOD  AMMONIA  MAGNESIUM  PHOSPHORUS  LACTIC ACID, PLASMA  OSMOLALITY, URINE  SODIUM, URINE, RANDOM  COMPREHENSIVE METABOLIC PANEL  CBC  PROTIME-INR  TROPONIN I (HIGH SENSITIVITY)    EKG EKG Interpretation  Date/Time:  Wednesday June 24 2020 09:46:04 EDT Ventricular Rate:  46 PR Interval:    QRS Duration: 148 QT Interval:  508 QTC  Calculation: 445 R Axis:   -38 Text Interpretation: Sinus bradycardia Nonspecific IVCD with LAD LVH with secondary repolarization abnormality Anterolateral infarct, age indeterminate No old tracing to compare Confirmed by Isla Pence [phone]) on 06/24/2020 9:50:43 AM   Radiology CT ABDOMEN PELVIS WO CONTRAST  Result Date: 06/24/2020 CLINICAL DATA:  Epigastric pain with poor appetite. EXAM: CT CHEST, ABDOMEN AND PELVIS WITHOUT CONTRAST TECHNIQUE: Multidetector CT imaging of the chest, abdomen and pelvis was performed following the standard protocol without IV contrast. COMPARISON:  Only plain film comparison is are available. FINDINGS: CT CHEST FINDINGS Cardiovascular: Calcified atheromatous plaque in the thoracic aorta. No aneurysmal dilation. Three-vessel coronary artery disease. Central pulmonary vasculature grossly unremarkable on noncontrast imaging. Heart size normal. Mitral annular calcifications. No pericardial effusion. Mediastinum/Nodes: Thoracic inlet structures are normal. No axillary lymphadenopathy. RIGHT breast mass like area measuring 2.9 x 2.6 cm (image 19, series 2) No thoracic inlet adenopathy. No hilar adenopathy. No mediastinal lymphadenopathy. Mildly patulous esophagus in the setting of large hiatal hernia with nearly the entire stomach herniated into the chest. Lungs/Pleura: Airways are patent. No consolidation. No pleural effusion. Musculoskeletal: See below for full musculoskeletal details. Spinal degenerative changes. Evidence of prior trauma to the RIGHT and LEFT chest with healed rib fractures bilaterally, some subacute and some chronic. CT ABDOMEN PELVIS FINDINGS Hepatobiliary: Diffuse infiltration of the liver with multiple lesions present throughout the RIGHT and LEFT hepatic lobe. Largest in the central liver measuring approximately 4.6 x 4.2 cm on image 48 of series 2. Multiple additional smaller lesions involving nearly all of the visible hepatic parenchyma, every hepatic  subsegment is involved. Another large lesion is seen in the inferior RIGHT hepatic lobe, hepatic subsegment VI (image 66, series 2) 3.8 x 3.6 cm. Cholelithiasis in a collapsed gallbladder. Numerous gallstones fill the  lumen. These are in the 1-1.2 cm range. Pancreas: Pancreatic atrophy without signs of inflammation pancreatic bed. Spleen: Spleen normal size and contour. Adrenals/Urinary Tract: Adrenal glands are normal. RIGHT renal cyst. This measures 6.8 x 3.5 cm, I uniformly water dense. No hydronephrosis. Cortical cyst on the LEFT extending into the renal sinus moderate size, smaller than the contralateral cyst. Numerous areas of nodularity throughout the urinary bladder, for instance on image 95 of series 2 a 2.9 x 0.6 cm lesion is present in the bladder with involvement of the anterior bladder, contiguous with smaller areas of nodularity throughout the anterior bladder. Bilateral lesions along the lateral aspects of the urinary bladder and an additional lesion at 1.4 x 1.1 cm in the dome of the urinary bladder. Stomach/Bowel: Nearly the entire stomach is herniated into the chest. No perigastric stranding. No bowel obstruction. Normal appendix. Colonic diverticulosis. Stool fills much of the colon. Vascular/Lymphatic: Calcified atheromatous plaque in the abdominal aorta. No aneurysmal dilation. There is no gastrohepatic or hepatoduodenal ligament lymphadenopathy. No retroperitoneal or mesenteric lymphadenopathy. No pelvic sidewall lymphadenopathy. Reproductive: Post hysterectomy. No adnexal masses. Trace fluid in the pelvis adjacent to RIGHT adnexa measures simple fluid approximately 2.1 cm greatest dimension. Other: No ascites.  No free air. Musculoskeletal: Visualized clavicles and scapulae are intact. Sternum it is intact. Bilateral rib fractures, subacute and chronic. None with significant displacement. Spinal degenerative changes. No acute or destructive bone process. The LEFT lateral listhesis in the  setting of degenerative change of L4 on L5 and curvature of the spine also likely related to degenerative change in the lumbar spine. IMPRESSION: 1. Numerous areas of nodularity throughout the urinary bladder with involvement of the anterior bladder, consistent with multifocal urothelial neoplasm. 2. RIGHT breast mass suspicious for breast cancer. 3. Multiple lesions throughout the RIGHT and LEFT hepatic lobe, every hepatic subsegment is involved. Findings are most consistent with metastatic disease. Differential considerations would include metastatic urothelial carcinoma or metastatic breast cancer given findings on the current study. Note that there is only mild enlargement of RIGHT axillary lymph nodes, largest approximately 8 mm, synchronous primaries are also considered. 4. Large hiatal hernia with nearly the entire stomach herniated into the chest. 5. Trace free fluid in the pelvis of uncertain significance perhaps related to underlying liver dysfunction in the setting of diffuse hepatic metastatic disease though there is no fluid elsewhere in the abdomen or pelvis. 6. Cholelithiasis without evidence of acute cholecystitis. 7. Three-vessel coronary artery disease. 8. Evidence of prior trauma to the RIGHT and LEFT chest with healed rib fractures bilaterally, some subacute and chronic. 9. Aortic atherosclerosis. Aortic Atherosclerosis (ICD10-I70.0). Electronically Signed   By: Zetta Bills M.D.   On: 06/24/2020 13:43   CT Chest Wo Contrast  Result Date: 06/24/2020 CLINICAL DATA:  Epigastric pain with poor appetite. EXAM: CT CHEST, ABDOMEN AND PELVIS WITHOUT CONTRAST TECHNIQUE: Multidetector CT imaging of the chest, abdomen and pelvis was performed following the standard protocol without IV contrast. COMPARISON:  Only plain film comparison is are available. FINDINGS: CT CHEST FINDINGS Cardiovascular: Calcified atheromatous plaque in the thoracic aorta. No aneurysmal dilation. Three-vessel coronary artery  disease. Central pulmonary vasculature grossly unremarkable on noncontrast imaging. Heart size normal. Mitral annular calcifications. No pericardial effusion. Mediastinum/Nodes: Thoracic inlet structures are normal. No axillary lymphadenopathy. RIGHT breast mass like area measuring 2.9 x 2.6 cm (image 19, series 2) No thoracic inlet adenopathy. No hilar adenopathy. No mediastinal lymphadenopathy. Mildly patulous esophagus in the setting of large hiatal hernia with nearly the  entire stomach herniated into the chest. Lungs/Pleura: Airways are patent. No consolidation. No pleural effusion. Musculoskeletal: See below for full musculoskeletal details. Spinal degenerative changes. Evidence of prior trauma to the RIGHT and LEFT chest with healed rib fractures bilaterally, some subacute and some chronic. CT ABDOMEN PELVIS FINDINGS Hepatobiliary: Diffuse infiltration of the liver with multiple lesions present throughout the RIGHT and LEFT hepatic lobe. Largest in the central liver measuring approximately 4.6 x 4.2 cm on image 48 of series 2. Multiple additional smaller lesions involving nearly all of the visible hepatic parenchyma, every hepatic subsegment is involved. Another large lesion is seen in the inferior RIGHT hepatic lobe, hepatic subsegment VI (image 66, series 2) 3.8 x 3.6 cm. Cholelithiasis in a collapsed gallbladder. Numerous gallstones fill the lumen. These are in the 1-1.2 cm range. Pancreas: Pancreatic atrophy without signs of inflammation pancreatic bed. Spleen: Spleen normal size and contour. Adrenals/Urinary Tract: Adrenal glands are normal. RIGHT renal cyst. This measures 6.8 x 3.5 cm, I uniformly water dense. No hydronephrosis. Cortical cyst on the LEFT extending into the renal sinus moderate size, smaller than the contralateral cyst. Numerous areas of nodularity throughout the urinary bladder, for instance on image 95 of series 2 a 2.9 x 0.6 cm lesion is present in the bladder with involvement of the  anterior bladder, contiguous with smaller areas of nodularity throughout the anterior bladder. Bilateral lesions along the lateral aspects of the urinary bladder and an additional lesion at 1.4 x 1.1 cm in the dome of the urinary bladder. Stomach/Bowel: Nearly the entire stomach is herniated into the chest. No perigastric stranding. No bowel obstruction. Normal appendix. Colonic diverticulosis. Stool fills much of the colon. Vascular/Lymphatic: Calcified atheromatous plaque in the abdominal aorta. No aneurysmal dilation. There is no gastrohepatic or hepatoduodenal ligament lymphadenopathy. No retroperitoneal or mesenteric lymphadenopathy. No pelvic sidewall lymphadenopathy. Reproductive: Post hysterectomy. No adnexal masses. Trace fluid in the pelvis adjacent to RIGHT adnexa measures simple fluid approximately 2.1 cm greatest dimension. Other: No ascites.  No free air. Musculoskeletal: Visualized clavicles and scapulae are intact. Sternum it is intact. Bilateral rib fractures, subacute and chronic. None with significant displacement. Spinal degenerative changes. No acute or destructive bone process. The LEFT lateral listhesis in the setting of degenerative change of L4 on L5 and curvature of the spine also likely related to degenerative change in the lumbar spine. IMPRESSION: 1. Numerous areas of nodularity throughout the urinary bladder with involvement of the anterior bladder, consistent with multifocal urothelial neoplasm. 2. RIGHT breast mass suspicious for breast cancer. 3. Multiple lesions throughout the RIGHT and LEFT hepatic lobe, every hepatic subsegment is involved. Findings are most consistent with metastatic disease. Differential considerations would include metastatic urothelial carcinoma or metastatic breast cancer given findings on the current study. Note that there is only mild enlargement of RIGHT axillary lymph nodes, largest approximately 8 mm, synchronous primaries are also considered. 4. Large  hiatal hernia with nearly the entire stomach herniated into the chest. 5. Trace free fluid in the pelvis of uncertain significance perhaps related to underlying liver dysfunction in the setting of diffuse hepatic metastatic disease though there is no fluid elsewhere in the abdomen or pelvis. 6. Cholelithiasis without evidence of acute cholecystitis. 7. Three-vessel coronary artery disease. 8. Evidence of prior trauma to the RIGHT and LEFT chest with healed rib fractures bilaterally, some subacute and chronic. 9. Aortic atherosclerosis. Aortic Atherosclerosis (ICD10-I70.0). Electronically Signed   By: Zetta Bills M.D.   On: 06/24/2020 13:43   DG Chest Portable  1 View  Result Date: 06/24/2020 CLINICAL DATA:  Recent fall, decreased appetite, increased weakness, dementia, altered mental status, history hypertension EXAM: PORTABLE CHEST 1 VIEW COMPARISON:  Portable exam 1003 hours without priors for comparison FINDINGS: Borderline enlargement of cardiac silhouette. Mediastinal contours and pulmonary vascularity normal. Atherosclerotic calcification aorta. Atelectasis versus infiltrate at LEFT base. Remaining lungs clear. No pleural effusion or pneumothorax. LEFT glenohumeral degenerative changes. IMPRESSION: Atelectasis versus infiltrate at LEFT lower lobe. Aortic Atherosclerosis (ICD10-I70.0). Electronically Signed   By: Lavonia Dana M.D.   On: 06/24/2020 10:20    Procedures Procedures   Medications Ordered in ED Medications  heparin injection 5,000 Units (has no administration in time range)  acetaminophen (TYLENOL) tablet 650 mg (has no administration in time range)    Or  acetaminophen (TYLENOL) suppository 650 mg (has no administration in time range)  lactated ringers infusion ( Intravenous New Bag/Given 06/24/20 1717)  ondansetron (ZOFRAN) tablet 4 mg (has no administration in time range)    Or  ondansetron (ZOFRAN) injection 4 mg (has no administration in time range)  thiamine tablet 100 mg  (100 mg Oral Not Given 1/61/09 6045)  folic acid (FOLVITE) tablet 1 mg (1 mg Oral Not Given 06/24/20 1716)  escitalopram (LEXAPRO) tablet 10 mg (has no administration in time range)  latanoprost (XALATAN) 0.005 % ophthalmic solution 1 drop (has no administration in time range)  levothyroxine (SYNTHROID) tablet 75 mcg (has no administration in time range)  omega-3 acid ethyl esters (LOVAZA) capsule 1 g (has no administration in time range)  multivitamin with minerals tablet 1 tablet (has no administration in time range)  diphenhydrAMINE (BENADRYL) capsule 25 mg (has no administration in time range)    And  acetaminophen (TYLENOL) tablet 500 mg (has no administration in time range)  pantoprazole (PROTONIX) injection 40 mg (40 mg Intravenous Given 06/24/20 1715)  hydrALAZINE (APRESOLINE) tablet 10 mg (has no administration in time range)  sodium chloride 0.9 % bolus 250 mL (0 mLs Intravenous Stopped 06/24/20 1337)    ED Course  I have reviewed the triage vital signs and the nursing notes.  Pertinent labs & imaging results that were available during my care of the patient were reviewed by me and considered in my medical decision making (see chart for details).    MDM Rules/Calculators/A&P                         Patient is a 87 year old woman who presents today with her son for evaluation of poor appetite for about a week.  On exam she has mild epigastric tenderness. She had labs by her PCP yesterday that were abnormal.  Here her CBC is without abnormalities.  She has multiple derangements on her CMP including creatinine elevated at 1.68 with a GFR of 28, elevated AST and ALT at 139 and 64 respectively with an alk phos of 287.  Her sodium is low at 125 with a potassium of 54, chloride of 95 and CO2 of 21.  Her EKG showed bradycardia.  UA has blood clear evidence of infection. CT scan unfortunately shows concern for extensive cancer.  She appears to have a mass in her right breast consistent  with cancer, along with abnormalities in her bladder and masses in her liver.  Patiently she appears to have lymph node involvement.  I suspect that her abnormal liver enzymes are due to her extensive liver masses. I discussed the CT scan findings at length with patient's son who is  at bedside.  We discussed that she appears to have extensive cancer that has spread, in addition to hiatal hernia that appears significant with her entire stomach  herniated into her chest.  We discussed role of admission and patient is in agreement.  Hospitalist is consulted for admission.   Her Chest ct showed multiple rib fractures, unclear how acute these are however patient does not have pain with palpation of chest wall. Question pathologic fracture.   Covid negative.   The patient appears reasonably stabilized for admission considering the current resources, flow, and capabilities available in the ED at this time, and I doubt any other [facility] requiring further screening and/or treatment in the ED prior to admission assuming timely admission and bed placement.  Note: Portions of this report may have been transcribed using voice recognition software. Every effort was made to ensure accuracy; however, inadvertent computerized transcription errors may be present  Final Clinical Impression(s) / ED Diagnoses Final diagnoses:  Urothelial cancer ([place])  Breast mass, right  Liver masses  Transaminitis  Hiatal hernia  Atherosclerosis of native coronary artery of native heart, unspecified whether angina present  Calculus of gallbladder without cholecystitis without obstruction  Bradycardia  Dementia with behavioral disturbance, unspecified dementia type [facility])    Rx / DC Orders ED Discharge Orders    None       Ollen Gross 06/24/20 1810    Isla Pence, MD 06/25/20 [phone]

## 2020-06-24 NOTE — H&P (Addendum)
History and Physical    Sheri Miller DPO:[id number] DOB: [birth year] DOA: 06/24/2020  PCP: Wendie Agreste, MD   Patient coming from: Home on advise of PCP for abnormal labs.  Chief Complaint  Patient presents with  . Fall  . abnormal labs     HPI: Sheri Miller is a 87 y.o. female with medical history significant for dementia, hypertension on amlodipine/lisinopril, depression on Lexapro, Depakote, hypothyroidism, hyperlipidemia seen by the ED due to abnormal lab with renal failure and hyperkalemia.  Patient is a poor historian. As per the report patient has had reduced appetite for a week, not eating well this bite family encouragement and has been very fatigued but no fever nausea vomiting.  She was taken to PCP 3/15 routine labs were drawn and sent home.  She also had a recent fall from the chair.  Today PCP called her son requesting to take her to ER due to hyperkalemia renal failure and abnormal LFTs.  ED Course: Blood pressure is stable in 120s to 150s, saturating well on room air but presently bradycardic in 40s respiration 18.  Blood work showed improving hyperkalemia 6.2 > 5.4, renal failure 1.86> 1.68, hyponatremia 431> 540, metabolic acidosis bicarb 21 elevated alkaline phosphatase abnormal AST/ALT 139/64, UA showed RBC more than 50 WBC 11-20 nitrite leukocytes negative, chest x-ray atelectasis versus infiltrate left lower lobe subsequently underwent CT chest abdomen pelvis without IV contrast that showed significant finding " concerning for multifocal urothelial neoplasm, right breast mass suspicious for breast cancer with multiple lesions right left hepatic lobe-involving every hepatic subsegment-consistent with metastatic disease, also showed large hiatal hernia with nearly the entire stomach herniated into the chest, trace fluid in the pelvis, cholelithiasis, three-vessel coronary artery disease, healed b/l rib fractures. 250 mill bolus normal saline was given, blood culture was sent  and admission was requested. On my interview she is pleasantly confused does not know where she is, recognizes husband at the bedside, asking to pee multiple times despite having purewick in place.  Review of Systems: negative except as mentioned in HPI above. Negative for fever Negative for chest pain Negative for shortness of breath DOES HAVE DEMENTIA limiting full ROS REVIEW.  Past Medical History:  Diagnosis Date  . Blind    legally blindness  . Dementia ([place])   . Glaucoma    in both eyes  . Hyperlipidemia   . Hypertension   . Macular degeneration     History reviewed. No pertinent surgical history.   reports that she has never smoked. She has never used smokeless tobacco. She reports that she does not drink alcohol and does not use drugs.  No Known Allergies  Family History  Problem Relation Age of Onset  . Dementia Brother      Prior to Admission medications   Medication Sig Start Date End Date Taking? Authorizing Provider  amLODipine (NORVASC) 2.5 MG tablet Take 1 tablet (2.5 mg total) by mouth daily. 03/18/20  Yes Wendie Agreste, MD  diphenhydramine-acetaminophen (TYLENOL PM) 25-500 MG TABS tablet Take 1 tablet by mouth at bedtime.   Yes [provider]  escitalopram (LEXAPRO) 10 MG tablet TAKE 1 TABLET(10 MG) BY MOUTH AT BEDTIME Patient taking differently: Take 10 mg by mouth daily. 04/29/20  Yes Wendie Agreste, MD  latanoprost (XALATAN) 0.005 % ophthalmic solution INSTILL 1 DROP IN BOTH EYES AT BEDTIME Patient taking differently: Place 1 drop into both eyes at bedtime. 09/18/19  Yes Wendie Agreste, MD  levothyroxine (SYNTHROID) 75 MCG  tablet Take 1 tablet (75 mcg total) by mouth daily before breakfast. 06/24/20  Yes Just, Laurita Quint, FNP  lisinopril (ZESTRIL) 20 MG tablet Take 1 tablet (20 mg total) by mouth daily. 03/18/20  Yes Wendie Agreste, MD  Multiple Vitamins-Minerals (ULTRA WOMENS PACK PO) Take 1 tablet by mouth daily.   Yes [provider]  omega-3 acid ethyl esters (LOVAZA) 1 g capsule Take 1 g by mouth daily.   Yes [provider]  divalproex (DEPAKOTE SPRINKLE) 125 MG capsule TAKE 1 CAPSULE BY MOUTH IN THE EVENING AS NEEDED FOR AGITATION Patient not taking: No sig reported 06/15/20   [name], MD    Physical Exam: Vitals:   06/24/20 1231 06/24/20 1245 06/24/20 1300 06/24/20 1332  BP: (!) 146/60  (!) 149/61 (!) 138/50  Pulse: (!) 46 (!) 46 (!) 45 (!) 43  Resp: 18  16 18   Temp:      TempSrc:      SpO2: 98% 98% 100% 98%  Weight:      Height:       General exam: AAOx1-2 ,thin,m frail,NAD, weak appearing. HEENT:Oral mucosa dry, Ear/Nose WNL grossly, dentition normal. Respiratory system: bilaterally basal crackles-dry,no use of accessory muscle Cardiovascular system: S1 & S2 +, No JVD. Gastrointestinal system: Abdomen soft, tender suprapubic area,BS+ Nervous System:Alert, awake, moving all 4 extremities and grossly nonfocal Extremities: No edema, distal peripheral pulses palpable.  Skin: No rashes,no icterus. Rt breast  Mass firm. MSK: Normal muscle bulk,tone, power.  Labs on Admission: I have personally reviewed following labs and imaging studies  CBC: Recent Labs  Lab 06/23/20 1041 06/24/20 1027  WBC 8.0 6.0  NEUTROABS 4.9 3.9  HGB 13.5 12.9  HCT 41.3 39.3  MCV 85 86.0  PLT 424 191   Basic Metabolic Panel: Recent Labs  Lab 06/23/20 1041 06/24/20 1027  NA 128* 125*  K 6.2* 5.4*  CL 92* 95*  CO2 19* 21*  GLUCOSE 85 95  BUN 31 36*  CREATININE 1.86* 1.68*  CALCIUM 9.7 9.0  MG  --  2.1  PHOS  --  3.5   GFR: Estimated Creatinine Clearance: 15.5 mL/min (A) (by C-G formula based on SCr of 1.68 mg/dL (H)). Liver Function Tests: Recent Labs  Lab 06/23/20 1041 06/24/20 1027  AST 176* 139*  ALT 75* 64*  ALKPHOS 393* 287*  BILITOT 0.8 1.0  PROT 7.7 6.9  ALBUMIN 3.9 3.0*   Recent Labs  Lab 06/24/20 1027  LIPASE 44   Recent Labs  Lab 06/24/20 1027  AMMONIA 26    Coagulation Profile: No results for input(s): INR, PROTIME in the last 168 hours. Cardiac Enzymes: No results for input(s): CKTOTAL, CKMB, CKMBINDEX, TROPONINI in the last 168 hours. BNP (last 3 results) No results for input(s): PROBNP in the last 8760 hours. HbA1C: No results for input(s): HGBA1C in the last 72 hours. CBG: No results for input(s): GLUCAP in the last 168 hours. Lipid Profile: No results for input(s): CHOL, HDL, LDLCALC, TRIG, CHOLHDL, LDLDIRECT in the last 72 hours. Thyroid Function Tests: Recent Labs    06/23/20 1041  TSH 6.860*  T4TOTAL 6.4   Anemia Panel: Recent Labs    06/23/20 1041  VITAMINB12 >2000*   Urine analysis:    Component Value Date/Time   COLORURINE YELLOW 06/24/2020 0937   APPEARANCEUR HAZY (A) 06/24/2020 0937   LABSPEC 1.006 06/24/2020 0937   PHURINE 5.0 06/24/2020 0937   GLUCOSEU NEGATIVE 06/24/2020 0937   HGBUR LARGE (A) 06/24/2020 4782  BILIRUBINUR NEGATIVE 06/24/2020 Andrew negative 04/18/2018 1154   KETONESUR NEGATIVE 06/24/2020 0937   PROTEINUR NEGATIVE 06/24/2020 0937   UROBILINOGEN 0.2 04/18/2018 1154   NITRITE NEGATIVE 06/24/2020 0937   LEUKOCYTESUR NEGATIVE 06/24/2020 5732    Radiological Exams on Admission: CT ABDOMEN PELVIS WO CONTRAST  Result Date: 06/24/2020 CLINICAL DATA:  Epigastric pain with poor appetite. EXAM: CT CHEST, ABDOMEN AND PELVIS WITHOUT CONTRAST TECHNIQUE: Multidetector CT imaging of the chest, abdomen and pelvis was performed following the standard protocol without IV contrast. COMPARISON:  Only plain film comparison is are available. FINDINGS: CT CHEST FINDINGS Cardiovascular: Calcified atheromatous plaque in the thoracic aorta. No aneurysmal dilation. Three-vessel coronary artery disease. Central pulmonary vasculature grossly unremarkable on noncontrast imaging. Heart size normal. Mitral annular calcifications. No pericardial effusion. Mediastinum/Nodes: Thoracic inlet structures are  normal. No axillary lymphadenopathy. RIGHT breast mass like area measuring 2.9 x 2.6 cm (image 19, series 2) No thoracic inlet adenopathy. No hilar adenopathy. No mediastinal lymphadenopathy. Mildly patulous esophagus in the setting of large hiatal hernia with nearly the entire stomach herniated into the chest. Lungs/Pleura: Airways are patent. No consolidation. No pleural effusion. Musculoskeletal: See below for full musculoskeletal details. Spinal degenerative changes. Evidence of prior trauma to the RIGHT and LEFT chest with healed rib fractures bilaterally, some subacute and some chronic. CT ABDOMEN PELVIS FINDINGS Hepatobiliary: Diffuse infiltration of the liver with multiple lesions present throughout the RIGHT and LEFT hepatic lobe. Largest in the central liver measuring approximately 4.6 x 4.2 cm on image 48 of series 2. Multiple additional smaller lesions involving nearly all of the visible hepatic parenchyma, every hepatic subsegment is involved. Another large lesion is seen in the inferior RIGHT hepatic lobe, hepatic subsegment VI (image 66, series 2) 3.8 x 3.6 cm. Cholelithiasis in a collapsed gallbladder. Numerous gallstones fill the lumen. These are in the 1-1.2 cm range. Pancreas: Pancreatic atrophy without signs of inflammation pancreatic bed. Spleen: Spleen normal size and contour. Adrenals/Urinary Tract: Adrenal glands are normal. RIGHT renal cyst. This measures 6.8 x 3.5 cm, I uniformly water dense. No hydronephrosis. Cortical cyst on the LEFT extending into the renal sinus moderate size, smaller than the contralateral cyst. Numerous areas of nodularity throughout the urinary bladder, for instance on image 95 of series 2 a 2.9 x 0.6 cm lesion is present in the bladder with involvement of the anterior bladder, contiguous with smaller areas of nodularity throughout the anterior bladder. Bilateral lesions along the lateral aspects of the urinary bladder and an additional lesion at 1.4 x 1.1 cm in the  dome of the urinary bladder. Stomach/Bowel: Nearly the entire stomach is herniated into the chest. No perigastric stranding. No bowel obstruction. Normal appendix. Colonic diverticulosis. Stool fills much of the colon. Vascular/Lymphatic: Calcified atheromatous plaque in the abdominal aorta. No aneurysmal dilation. There is no gastrohepatic or hepatoduodenal ligament lymphadenopathy. No retroperitoneal or mesenteric lymphadenopathy. No pelvic sidewall lymphadenopathy. Reproductive: Post hysterectomy. No adnexal masses. Trace fluid in the pelvis adjacent to RIGHT adnexa measures simple fluid approximately 2.1 cm greatest dimension. Other: No ascites.  No free air. Musculoskeletal: Visualized clavicles and scapulae are intact. Sternum it is intact. Bilateral rib fractures, subacute and chronic. None with significant displacement. Spinal degenerative changes. No acute or destructive bone process. The LEFT lateral listhesis in the setting of degenerative change of L4 on L5 and curvature of the spine also likely related to degenerative change in the lumbar spine. IMPRESSION: 1. Numerous areas of nodularity throughout the urinary bladder with involvement  of the anterior bladder, consistent with multifocal urothelial neoplasm. 2. RIGHT breast mass suspicious for breast cancer. 3. Multiple lesions throughout the RIGHT and LEFT hepatic lobe, every hepatic subsegment is involved. Findings are most consistent with metastatic disease. Differential considerations would include metastatic urothelial carcinoma or metastatic breast cancer given findings on the current study. Note that there is only mild enlargement of RIGHT axillary lymph nodes, largest approximately 8 mm, synchronous primaries are also considered. 4. Large hiatal hernia with nearly the entire stomach herniated into the chest. 5. Trace free fluid in the pelvis of uncertain significance perhaps related to underlying liver dysfunction in the setting of diffuse  hepatic metastatic disease though there is no fluid elsewhere in the abdomen or pelvis. 6. Cholelithiasis without evidence of acute cholecystitis. 7. Three-vessel coronary artery disease. 8. Evidence of prior trauma to the RIGHT and LEFT chest with healed rib fractures bilaterally, some subacute and chronic. 9. Aortic atherosclerosis. Aortic Atherosclerosis (ICD10-I70.0). Electronically Signed   By: Zetta Bills M.D.   On: 06/24/2020 13:43   CT Chest Wo Contrast  Result Date: 06/24/2020 CLINICAL DATA:  Epigastric pain with poor appetite. EXAM: CT CHEST, ABDOMEN AND PELVIS WITHOUT CONTRAST TECHNIQUE: Multidetector CT imaging of the chest, abdomen and pelvis was performed following the standard protocol without IV contrast. COMPARISON:  Only plain film comparison is are available. FINDINGS: CT CHEST FINDINGS Cardiovascular: Calcified atheromatous plaque in the thoracic aorta. No aneurysmal dilation. Three-vessel coronary artery disease. Central pulmonary vasculature grossly unremarkable on noncontrast imaging. Heart size normal. Mitral annular calcifications. No pericardial effusion. Mediastinum/Nodes: Thoracic inlet structures are normal. No axillary lymphadenopathy. RIGHT breast mass like area measuring 2.9 x 2.6 cm (image 19, series 2) No thoracic inlet adenopathy. No hilar adenopathy. No mediastinal lymphadenopathy. Mildly patulous esophagus in the setting of large hiatal hernia with nearly the entire stomach herniated into the chest. Lungs/Pleura: Airways are patent. No consolidation. No pleural effusion. Musculoskeletal: See below for full musculoskeletal details. Spinal degenerative changes. Evidence of prior trauma to the RIGHT and LEFT chest with healed rib fractures bilaterally, some subacute and some chronic. CT ABDOMEN PELVIS FINDINGS Hepatobiliary: Diffuse infiltration of the liver with multiple lesions present throughout the RIGHT and LEFT hepatic lobe. Largest in the central liver measuring  approximately 4.6 x 4.2 cm on image 48 of series 2. Multiple additional smaller lesions involving nearly all of the visible hepatic parenchyma, every hepatic subsegment is involved. Another large lesion is seen in the inferior RIGHT hepatic lobe, hepatic subsegment VI (image 66, series 2) 3.8 x 3.6 cm. Cholelithiasis in a collapsed gallbladder. Numerous gallstones fill the lumen. These are in the 1-1.2 cm range. Pancreas: Pancreatic atrophy without signs of inflammation pancreatic bed. Spleen: Spleen normal size and contour. Adrenals/Urinary Tract: Adrenal glands are normal. RIGHT renal cyst. This measures 6.8 x 3.5 cm, I uniformly water dense. No hydronephrosis. Cortical cyst on the LEFT extending into the renal sinus moderate size, smaller than the contralateral cyst. Numerous areas of nodularity throughout the urinary bladder, for instance on image 95 of series 2 a 2.9 x 0.6 cm lesion is present in the bladder with involvement of the anterior bladder, contiguous with smaller areas of nodularity throughout the anterior bladder. Bilateral lesions along the lateral aspects of the urinary bladder and an additional lesion at 1.4 x 1.1 cm in the dome of the urinary bladder. Stomach/Bowel: Nearly the entire stomach is herniated into the chest. No perigastric stranding. No bowel obstruction. Normal appendix. Colonic diverticulosis. Stool fills much of  the colon. Vascular/Lymphatic: Calcified atheromatous plaque in the abdominal aorta. No aneurysmal dilation. There is no gastrohepatic or hepatoduodenal ligament lymphadenopathy. No retroperitoneal or mesenteric lymphadenopathy. No pelvic sidewall lymphadenopathy. Reproductive: Post hysterectomy. No adnexal masses. Trace fluid in the pelvis adjacent to RIGHT adnexa measures simple fluid approximately 2.1 cm greatest dimension. Other: No ascites.  No free air. Musculoskeletal: Visualized clavicles and scapulae are intact. Sternum it is intact. Bilateral rib fractures,  subacute and chronic. None with significant displacement. Spinal degenerative changes. No acute or destructive bone process. The LEFT lateral listhesis in the setting of degenerative change of L4 on L5 and curvature of the spine also likely related to degenerative change in the lumbar spine. IMPRESSION: 1. Numerous areas of nodularity throughout the urinary bladder with involvement of the anterior bladder, consistent with multifocal urothelial neoplasm. 2. RIGHT breast mass suspicious for breast cancer. 3. Multiple lesions throughout the RIGHT and LEFT hepatic lobe, every hepatic subsegment is involved. Findings are most consistent with metastatic disease. Differential considerations would include metastatic urothelial carcinoma or metastatic breast cancer given findings on the current study. Note that there is only mild enlargement of RIGHT axillary lymph nodes, largest approximately 8 mm, synchronous primaries are also considered. 4. Large hiatal hernia with nearly the entire stomach herniated into the chest. 5. Trace free fluid in the pelvis of uncertain significance perhaps related to underlying liver dysfunction in the setting of diffuse hepatic metastatic disease though there is no fluid elsewhere in the abdomen or pelvis. 6. Cholelithiasis without evidence of acute cholecystitis. 7. Three-vessel coronary artery disease. 8. Evidence of prior trauma to the RIGHT and LEFT chest with healed rib fractures bilaterally, some subacute and chronic. 9. Aortic atherosclerosis. Aortic Atherosclerosis (ICD10-I70.0). Electronically Signed   By: Zetta Bills M.D.   On: 06/24/2020 13:43   DG Chest Portable 1 View  Result Date: 06/24/2020 CLINICAL DATA:  Recent fall, decreased appetite, increased weakness, dementia, altered mental status, history hypertension EXAM: PORTABLE CHEST 1 VIEW COMPARISON:  Portable exam 1003 hours without priors for comparison FINDINGS: Borderline enlargement of cardiac silhouette. Mediastinal  contours and pulmonary vascularity normal. Atherosclerotic calcification aorta. Atelectasis versus infiltrate at LEFT base. Remaining lungs clear. No pleural effusion or pneumothorax. LEFT glenohumeral degenerative changes. IMPRESSION: Atelectasis versus infiltrate at LEFT lower lobe. Aortic Atherosclerosis (ICD10-I70.0). Electronically Signed   By: Lavonia Dana M.D.   On: 06/24/2020 10:20     Assessment/Plan   Metastatic cancer to liver  Primary bladder malignancy and/or breast malignancy Multifocal urhothelial neoplasm w/ hematuria Firm non tender Rt breast mass: CT findings concerning for multifocal urothelial neoplas and  right breast mass suspicious for breast cancer with multiple lesions right left hepatic lobe-involving every hepatic subsegment-consistent with metastatic disease.  Will consult palliative care and further discussion- he seems to be wanting hospice care, given this new diagnoses which are new along with poor po intake/hiatal hernia with entire stomach in chest.  Hyperkalemia: Potassium is improving keep on IV fluid hydration repeat lab, Lokelma x1 if tolerating.  Hyponatremia/Dehydration:prerenal from poor intake vs from [facility]- for now ivf, ppi, dietatian eval. Check urine osmol,urine sod. 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   Recent Labs  Lab 06/23/20 1041 06/24/20 1027  BUN 31 36*  CREATININE 1.86* 1.68*   Dementia/Depression on Lexapro: Delirium precaution fall precaution supportive care.  Hiatal  hernia with CT scan showing entire stomach in the chest, monitor at risk of aspiration. Add ppi.  Benign essential HTN on amlodipine/lisinopril will be held for now.  Bradycardia appears asymptomatic monitor  Hypothyroidism: Recent TSH slightly elevated but T4 was normal.  Continue home  Synthroid  Hyperlipidemia continue home [place]   Failure to thrive/falls/deconditioning: pt ot, palliative and dietitian eval.  Goals Of care:extensively discussed w/ son poa at bedside- agrees with DNR and consult w/ palliative care. Body mass index is 23.05 kg/m.   Severity of Illness: * I certify that at the point of admission it is my clinical judgment that the patient will require inpatient hospital care spanning beyond 2 midnights from the point of admission due to high intensity of service, high risk for further deterioration and high frequency of surveillance required.   DVT prophylaxis: Heparin Code Status:   Code Status: DNR  Family Communication: Admission, patients condition and plan of care including tests being ordered have been discussed with the patient's son who indicate understanding and agree with the plan and Code Status.  Consults called:  Palliatve care  Antonieta Pert MD [facility]  If 7PM-7AM, please contact night-coverage [email or web address]  06/24/2020, 3:25 PM

## 2020-06-24 NOTE — ED Notes (Signed)
Pt. Placed on purwic. Nurses aware.

## 2020-06-24 NOTE — ED Notes (Signed)
Per Phylliss Bob PA, discontinue repeat troponin and lactic acid

## 2020-06-25 ENCOUNTER — Telehealth: Payer: Self-pay | Admitting: Family Medicine

## 2020-06-25 DIAGNOSIS — C799 Secondary malignant neoplasm of unspecified site: Secondary | ICD-10-CM

## 2020-06-25 DIAGNOSIS — Z515 Encounter for palliative care: Secondary | ICD-10-CM

## 2020-06-25 DIAGNOSIS — F0390 Unspecified dementia without behavioral disturbance: Secondary | ICD-10-CM

## 2020-06-25 DIAGNOSIS — K449 Diaphragmatic hernia without obstruction or gangrene: Secondary | ICD-10-CM

## 2020-06-25 DIAGNOSIS — Z7189 Other specified counseling: Secondary | ICD-10-CM

## 2020-06-25 DIAGNOSIS — N6310 Unspecified lump in the right breast, unspecified quadrant: Secondary | ICD-10-CM

## 2020-06-25 DIAGNOSIS — Z66 Do not resuscitate: Secondary | ICD-10-CM

## 2020-06-25 LAB — URINE CULTURE

## 2020-06-25 LAB — CBC
HCT: 38.8 % (ref 36.0–46.0)
Hemoglobin: 12.5 g/dL (ref 12.0–15.0)
MCH: 27.8 pg (ref 26.0–34.0)
MCHC: 32.2 g/dL (ref 30.0–36.0)
MCV: 86.2 fL (ref 80.0–100.0)
Platelets: 316 10*3/uL (ref 150–400)
RBC: 4.5 MIL/uL (ref 3.87–5.11)
RDW: 14.7 % (ref 11.5–15.5)
WBC: 7.5 10*3/uL (ref 4.0–10.5)
nRBC: 0 % (ref 0.0–0.2)

## 2020-06-25 LAB — COMPREHENSIVE METABOLIC PANEL
ALT: 52 U/L — ABNORMAL HIGH (ref 0–44)
AST: 126 U/L — ABNORMAL HIGH (ref 15–41)
Albumin: 3 g/dL — ABNORMAL LOW (ref 3.5–5.0)
Alkaline Phosphatase: 256 U/L — ABNORMAL HIGH (ref 38–126)
Anion gap: 11 (ref 5–15)
BUN: 30 mg/dL — ABNORMAL HIGH (ref 8–23)
CO2: 18 mmol/L — ABNORMAL LOW (ref 22–32)
Calcium: 8.8 mg/dL — ABNORMAL LOW (ref 8.9–10.3)
Chloride: 99 mmol/L (ref 98–111)
Creatinine, Ser: 1.37 mg/dL — ABNORMAL HIGH (ref 0.44–1.00)
GFR, Estimated: 36 mL/min — ABNORMAL LOW (ref >60)
Glucose, Bld: 90 mg/dL (ref 70–99)
Potassium: 4.6 mmol/L (ref 3.5–5.1)
Sodium: 128 mmol/L — ABNORMAL LOW (ref 135–145)
Total Bilirubin: 1.2 mg/dL (ref 0.3–1.2)
Total Protein: 6.9 g/dL (ref 6.5–8.1)

## 2020-06-25 LAB — PROTIME-INR
INR: 1.2 (ref 0.8–1.2)
Prothrombin Time: 14.7 seconds (ref 11.4–15.2)

## 2020-06-25 MED ORDER — MORPHINE SULFATE (CONCENTRATE) 10 MG/0.5ML PO SOLN: 5.0000 mg | ORAL | Status: DC | PRN

## 2020-06-25 MED ORDER — ENSURE ENLIVE PO LIQD
237.0000 mL | Freq: Two times a day (BID) | ORAL | Status: DC
  Administered 2020-06-26: 237 mL via ORAL

## 2020-06-25 MED ORDER — PANTOPRAZOLE SODIUM 40 MG PO TBEC
40.0000 mg | DELAYED_RELEASE_TABLET | Freq: Every day | ORAL | Status: DC
Start: 2020-06-25 — End: 2020-06-26
  Administered 2020-06-25 – 2020-06-26 (×2): 40 mg via ORAL
  Filled 2020-06-25 (×2): qty 1

## 2020-06-25 NOTE — Progress Notes (Signed)
Occupational Therapy Evaluation   Patient lives with her son and daughter in law in two story house, patient stays on main level. Prior to a week ago patient ambulated around the house without AD and was independent with basic ADLs however patient has had increased weakness and confusion with not eating at home. Family states having to help patient don shoes due to putting them on the wrong feet. Currently patient needing max A for bed mobility, mod A x2 for transfer due to posterior lean. Patient able to take small shuffled steps with walker and mod A x2 for safety. Recommend continued acute OT services to maximize patient endurance, balance and safety in order to reduce caregiver burden and return home with family support. Palliative services consulting with son/daughter in law at end of session therefore was unable to ask family if they feel they would like increased assist at home such as home health aid.    06/25/20 1400  OT Visit Information  Last OT Received On 06/25/20  Assistance Needed +2  PT/OT/SLP Co-Evaluation/Treatment Yes  Reason for Co-Treatment For patient/therapist safety;To address functional/ADL transfers  PT goals addressed during session Mobility/safety with mobility  OT goals addressed during session ADL's and self-care  History of Present Illness Sheri Miller is a 87 y.o. female with past medical history of dementia, hypertension, hyperlipidemia, hypothyroid, who presents today for evaluation of lab abnormalities, poor appetite, fell from  a chair onto right hip.  Precautions  Precautions Fall  Restrictions  Weight Bearing Restrictions No  Home Living  Family/patient expects to be discharged to: Private residence  Living Arrangements Children  Available Help at Discharge Family;Available 24 hours/day  Type of Home House  Home Access Stairs to enter  Entrance Stairs-Number of Steps 3  Entrance Stairs-Rails None  Home Layout Two level;Able to live on main level with  bedroom/bathroom  Home Equipment Cane - quad;Transport chair  Prior Function  Level of Independence Needs assistance  Gait / Transfers Assistance Needed until 1 week ago patient ambulated in the house independently, pushed w/c outside with son supervision  ADL's / Homemaking Assistance Needed until 1 week ago patient was independent with basic ADLs, had assist from family for IADLs. family reports within past week or so started putting shoes on wrong feet, ect  Communication  Communication HOH  Pain Assessment  Pain Assessment No/denies pain  Cognition  Arousal/Alertness Awake/alert  Behavior During Therapy WFL for tasks assessed/performed  Overall Cognitive Status History of cognitive impairments - at baseline  General Comments patient is very pleasant, follows instructions with multimodal cues and increased time  Upper Extremity Assessment  Upper Extremity Assessment Generalized weakness  Lower Extremity Assessment  Lower Extremity Assessment Defer to PT evaluation  ADL  Overall ADL's  Needs assistance/impaired  Grooming Set up;Sitting  Upper Body Bathing Set up;Sitting  Lower Body Bathing Moderate assistance;Sitting/lateral leans;Sit to/from stand  Upper Body Dressing  Set up;Sitting  Lower Body Dressing Sitting/lateral leans;Sit to/from stand;Maximal assistance  Lower Body Dressing Details (indicate cue type and reason) to don shoes, family reports patient has become more confused recently, placing shoes on wrong feet  Toilet Transfer Moderate assistance;+2 for physical assistance;+2 for safety/equipment;Stand-pivot;Cueing for safety;Cueing for sequencing;RW  Toilet Transfer Details (indicate cue type and reason) to recliner, posterior lean against bed initially, able to minimally correct when taking few steps towards chair. poor safety awareness holding onto walker when sitting  Toileting- Clothing Manipulation and Hygiene Moderate assistance;Sit to/from stand;Sitting/lateral lean   Functional mobility during ADLs  Moderate assistance;+2 for physical assistance;+2 for safety/equipment;Rolling walker;Cueing for safety;Cueing for sequencing  General ADL Comments patient requiring increased assistance with self care tasks due to decreased activity tolerance, balance, safety awareness  Bed Mobility  Overal bed mobility Needs Assistance  Bed Mobility Rolling;Sidelying to Sit  Rolling Mod assist  Sidelying to sit Max assist  General bed mobility comments multimodal cues to roll and reach for bed rail. max A to lower legs off EOB and upright trunk  Transfers  Overall transfer level Needs assistance  Equipment used Rolling walker (2 wheeled)  Transfers Sit to/from [employer] Transfers  Sit to Stand Mod assist;+2 safety/equipment;+2 physical assistance  Stand pivot transfers +2 physical assistance;+2 safety/equipment;Mod assist  General transfer comment patient needing mod A to power up to standing with posterior lean against bed initially. able to minimally correct and take small shuffled steps to recliner  Balance  Overall balance assessment History of Falls;Needs assistance  Sitting-balance support Feet supported  Sitting balance-Leahy Scale Fair  Postural control Posterior lean  Standing balance support During functional activity;Bilateral upper extremity supported  Standing balance-Leahy Scale Poor  Standing balance comment support of Rw and therapist, posterior bias  OT - End of Session  Equipment Utilized During Treatment Rolling walker  Activity Tolerance Patient tolerated treatment well  Patient left in chair;with call bell/phone within reach;with chair alarm set;with family/visitor present  Nurse Communication Mobility status  OT Assessment  OT Recommendation/Assessment Patient needs continued OT Services  OT Visit Diagnosis Unsteadiness on feet (R26.81);Other abnormalities of gait and mobility (R26.89);Muscle weakness (generalized) (M62.81)  OT Problem  List Decreased strength;Decreased activity tolerance;Impaired balance (sitting and/or standing);Decreased safety awareness  OT Plan  OT Frequency (ACUTE ONLY) Min 2X/week  OT Treatment/Interventions (ACUTE ONLY) Self-care/ADL training;Therapeutic exercise;Therapeutic activities;Patient/family education;Balance training  AM-PAC OT "6 Clicks" Daily Activity Outcome Measure (Version 2)  Help from another person eating meals? 3  Help from another person taking care of personal grooming? 3  Help from another person toileting, which includes using toliet, bedpan, or urinal? 2  Help from another person bathing (including washing, rinsing, drying)? 2  Help from another person to put on and taking off regular upper body clothing? 3  Help from another person to put on and taking off regular lower body clothing? 2  6 Click Score 15  OT Recommendation  Follow Up Recommendations Supervision/Assistance - 24 hour;Other (comment) (may benefit from home health aid if family needing increased support)  OT Equipment 3 in 1 bedside commode  Individuals Consulted  Consulted and Agree with Results and Recommendations Family member/caregiver  Family Member Consulted son  Acute Rehab OT Goals  Patient Stated Goal per son, agreed with getting patient up to recliner.  OT Goal Formulation With patient/family  Time For Goal Achievement 07/09/20  Potential to Achieve Goals Fair  OT Time Calculation  OT Start Time (ACUTE ONLY) 1023  OT Stop Time (ACUTE ONLY) 1040  OT Time Calculation (min) 17 min  OT General Charges  $OT Visit 1 Visit  Written Expression  Dominant Hand  (did not specify)   Delbert Phenix OT OT pager: [phone]

## 2020-06-25 NOTE — Telephone Encounter (Signed)
Received call from [place] with [facility]. Patient being discharged from hospital with Hospice on 3/18; Sheri Miller called to ask of Dr. Carlota Raspberry will be the patient's attending while under hospice care. [facility] needs to assign an attending before they can go out to visit the patient.  Please advise asap at [phone].

## 2020-06-25 NOTE — Consult Note (Addendum)
Consultation Note Date: 06/25/2020   Patient Name: Sheri Miller  DOB: 01-Apr-1924  MRN: [id number]  Age / Sex: 87 y.o., female  PCP: Wendie Agreste, MD Referring Physician: Antonieta Pert, MD  Reason for Consultation: Establishing goals of care  HPI/Patient Profile: 87 y.o. female  with past medical history of dementia, hypertension, depression, hypothyroidism, hyperlipidemia admitted on 06/24/2020 with abnormal labs (renal failure, abnormal LFT's, and hyperkalemia) from PCP. Family reports reduced appetite for approximately one week and fall from chair the day of admission. CT chest/abdomen/pelvis with significant findings concerning for multifocal urothelial neoplasm, right breast mass suspicious for breast cancer, multiple hepatic lesions consistent with metastatic disease and large hiatal hernia with nearly entire stomach herniated into chest, also three-vessel CAD. Receiving medical management for abnormal labs. Palliative medicine consultation for goals of care.    Clinical Assessment and Goals of Care:  I have reviewed medical records, discussed with Dr. Lupita Leash and met with patient's son [name]) and daughter-in-law [name]) at bedside to discuss goals of care. PT finishing up working with Kalman Shan and she is comfortable in recliner. She has pleasant confusion with baseline dementia. She does not participate in conversation. She denies pain and does not appear to be in pain or distress.   I introduced Palliative Medicine as specialized medical care for people living with serious illness. It focuses on providing relief from the symptoms and stress of a serious illness. The goal is to improve quality of life for both the patient and the family.  We discussed a brief life review of the patient. The patient has four children, but other three children are minimally involved in her life. [name] shares that him and [name] moved  [place] to [place] from [place] approximately 3 years ago when she was scammed and lost all her money. Zaria has lived with [name] and [name] since relocation. They are her primary caregivers and documented HCPOA's. Ongoing, progressive dementia but up until a few weeks ago, was still ambulating without walker. Oral intake has recently been poor. No known weight loss.   Discussed events leading up to admission and course of hospitalization including diagnoses, interventions, plan of care. [name] and [name] are appreciative of conversation with attending and have a good understanding of her condition and poor long-term prognosis. Medically recommended against aggressive oncology workup due to her age, frailty, and underlying dementia. Discussed poor candidacy for aggressive oncology treatment. [name] and [name] understand and agree. They wish for comfort for Markayla, pain management, and to initiate hospice services at home on discharge.  Discussed home hospice option and philosophy, emphasizing focus on comfort, symptom management, preventing recurrent hospitalization, allowing nature to take course, and dignity/peace as she nears EOL. Discussed typical cancer disease trajectory and anticipation she may decline quickly (as she already has in just a few weeks). Will need DME.  They provide Liddie's previously documented living will, HCPOA, and desire for natural death. Patient has clearly documented her wishes against prolonging measures if terminal or incurable. [name] confirms his decision for DNR code status  on admission.   MOST form discussed and completed. Decisions include: DNR/DNI, comfort focused pathway and initiation of hospice on discharge, IVF/ABX for time trial if indicated, and NO feeding tube. Electronic Vynca MOST completed. Copies of MOST form placed in chart and given to family.   Questions and concerns were addressed.  Hard Choices booklet and PMT contact information given. Family hopeful for discharge  home tomorrow 3/18 with hospice. Encouraged them to call PMT provider if needs arise.    SUMMARY OF RECOMMENDATIONS    Patient has a documented living will/HCPOA. Copy placed in chart. Son [name]) and DIL [name]) are documented HCPOA's and primary caregivers. Patient has clearly documented her wishes against life-prolonging measures if terminal or incurable.  Family decision to forgo aggressive oncology work-up.  Continue medical management and medical optimization inpatient.  Family wishes to take her home with hospice support, understanding hospice philosophy and poor long-term prognosis.  MOST form completed. Decisions include: DNR/DNI, comfort focused pathway with initiation of hospice on discharge, IVF/ABX for time trial, NO feeding tube. Electronic Vynca MOST completed.   TOC referral for home hospice services. Patient will need DME.   Family hopeful for discharge home tomorrow 3/18.   Code Status/Advance Care Planning:  DNR  Symptom Management:   Roxanol $Remove'5mg'kAGUypV$  SL q4h prn moderate pain/dyspnea/air hunger/tachypnea  Palliative Prophylaxis:   Aspiration, Bowel Regimen, Delirium Protocol, Frequent Pain Assessment, Oral Care and Turn Reposition  Additional Recommendations (Limitations, Scope, Preferences):  Medical optimization then home with hospice  Psycho-social/Spiritual:   Desire for further Chaplaincy support:yes  Additional Recommendations: Caregiving  Support/Resources, Compassionate Wean Education and Education on Hospice  Prognosis:   Poor long-term prognosis and eligible for home hospice services with CT scan concerning for metastatic cancer with ? Primary bladder or breast malignancy, multifocal urothelial neoplasm with hematuria, and liver involvement. Patient with underlying progressive dementia and declining functional/cognitive/nutritional status.   Discharge Planning: Home with Hospice      Primary Diagnoses: Present on Admission: . Metastatic  cancer ([facility]) . Hyperkalemia . AKI (acute kidney injury) ([place]) . Dementia ([place])   I have reviewed the medical record, interviewed the patient and family, and examined the patient. The following aspects are pertinent.  Past Medical History:  Diagnosis Date  . Blind    legally blindness  . Dementia ([place])   . Glaucoma    in both eyes  . Hyperlipidemia   . Hypertension   . Macular degeneration    Social History   Socioeconomic History  . Marital status: Single    Spouse name: Not on file  . Number of children: 4  . Years of education: Not on file  . Highest education level: Not on file  Occupational History  . Not on file  Tobacco Use  . Smoking status: Never Smoker  . Smokeless tobacco: Never Used  Vaping Use  . Vaping Use: Never used  Substance and Sexual Activity  . Alcohol use: Never  . Drug use: Never  . Sexual activity: Not on file  Other Topics Concern  . Not on file  Social History Narrative   Pt lives with her son and his family (wife and 2 sons) in 2 story home   Has 4 children   8th grade education   Retired Regulatory affairs officer    Social Determinants of Radio broadcast assistant Strain: Not on [employer] Insecurity: Not on file  Transportation Needs: Not on file  Physical Activity: Not on file  Stress: Not on file  Social Connections: Not on file   Family History  Problem Relation Age of Onset  . Dementia Brother    Scheduled Meds: . diphenhydrAMINE  25 mg Oral QHS   And  . acetaminophen  500 mg Oral QHS  . escitalopram  10 mg Oral QHS  . [START ON 06/26/2020] feeding supplement  237 mL Oral BID BM  . folic acid  1 mg Oral Daily  . heparin  5,000 Units Subcutaneous Q8H  . latanoprost  1 drop Both Eyes QHS  . levothyroxine  75 mcg Oral QAC breakfast  . multivitamin with minerals  1 tablet Oral Daily  . omega-3 acid ethyl esters  1 g Oral Daily  . pantoprazole  40 mg Oral Daily  . thiamine  100 mg Oral Daily   Continuous Infusions: . lactated  ringers 75 mL/hr at 06/25/20 0809   PRN Meds:.acetaminophen **OR** acetaminophen, hydrALAZINE, ondansetron **OR** ondansetron (ZOFRAN) IV Medications Prior to Admission:  Prior to Admission medications   Medication Sig Start Date End Date Taking? Authorizing Provider  amLODipine (NORVASC) 2.5 MG tablet Take 1 tablet (2.5 mg total) by mouth daily. 03/18/20  Yes Wendie Agreste, MD  diphenhydramine-acetaminophen (TYLENOL PM) 25-500 MG TABS tablet Take 1 tablet by mouth at bedtime.   Yes [provider]  escitalopram (LEXAPRO) 10 MG tablet TAKE 1 TABLET(10 MG) BY MOUTH AT BEDTIME Patient taking differently: Take 10 mg by mouth daily. 04/29/20  Yes Wendie Agreste, MD  latanoprost (XALATAN) 0.005 % ophthalmic solution INSTILL 1 DROP IN BOTH EYES AT BEDTIME Patient taking differently: Place 1 drop into both eyes at bedtime. 09/18/19  Yes Wendie Agreste, MD  levothyroxine (SYNTHROID) 75 MCG tablet Take 1 tablet (75 mcg total) by mouth daily before breakfast. 06/24/20  Yes Just, Laurita Quint, FNP  lisinopril (ZESTRIL) 20 MG tablet Take 1 tablet (20 mg total) by mouth daily. 03/18/20  Yes Wendie Agreste, MD  Multiple Vitamins-Minerals (ULTRA WOMENS PACK PO) Take 1 tablet by mouth daily.   Yes [provider]  omega-3 acid ethyl esters (LOVAZA) 1 g capsule Take 1 g by mouth daily.   Yes [provider]  divalproex (DEPAKOTE SPRINKLE) 125 MG capsule TAKE 1 CAPSULE BY MOUTH IN THE EVENING AS NEEDED FOR AGITATION Patient not taking: No sig reported 06/15/20   [name], MD   No Known Allergies Review of Systems  Unable to perform ROS: Dementia    Physical Exam Vitals and nursing note reviewed.  Constitutional:      Appearance: She is ill-appearing.  HENT:     Head: Normocephalic and atraumatic.  Pulmonary:     Effort: No tachypnea, accessory muscle usage or respiratory distress.     Comments: Room air Skin:    General: Skin is warm and dry.     Coloration: Skin  is pale.  Neurological:     Mental Status: She is easily aroused.     Comments: Oriented to self, otherwise pleasant confusion with baseline dementia.  Psychiatric:        Attention and Perception: She is inattentive.        Speech: Speech is delayed.        Cognition and Memory: Cognition is impaired.    Vital Signs: BP (!) 168/70 (BP Location: Right Arm)   Pulse (!) 57   Temp 97.7 F (36.5 C) (Oral)   Resp 16   Ht $R'5\' 1"'lH$  (1.549 m)   Wt 55.3 kg   SpO2 96%  BMI 23.05 kg/m  Pain Scale: 0-10   Pain Score: 0-No pain   SpO2: SpO2: 96 % O2 Device:SpO2: 96 % O2 Flow Rate: .   IO: Intake/output summary:   Intake/Output Summary (Last 24 hours) at 06/25/2020 1615 Last data filed at 06/25/2020 0541 Gross per 24 hour  Intake 759.34 ml  Output 600 ml  Net 159.34 ml    LBM: Last BM Date:  (pt unable to advice) Baseline Weight: Weight: 55.3 kg Most recent weight: Weight: 55.3 kg     Palliative Assessment/Data: PPS 50%   Flowsheet Rows   Flowsheet Row Most Recent Value  Intake Tab   Referral Department Hospitalist  Unit at Time of Referral Med/Surg Unit  Palliative Care Primary Diagnosis Cancer  Palliative Care Type New Palliative care  Reason for referral Clarify Goals of Care  Date first seen by Palliative Care 06/25/20  Clinical Assessment   Palliative Performance Scale Score 50%  Psychosocial & Spiritual Assessment   Palliative Care Outcomes   Patient/Family meeting held? Yes  Who was at the meeting? son and DIL (HCPOA's)  Palliative Care Outcomes Clarified goals of care, Counseled regarding hospice, Provided end of life care assistance, Provided advance care planning, Provided psychosocial or spiritual support, ACP counseling assistance, Transitioned to hospice       Time Total: 72min Greater than 50%  of this time was spent counseling and coordinating care related to the above assessment and plan.  Signed by:  Ihor Dow, DNP, FNP-C Palliative Medicine  Team  Phone: [phone] Fax: [phone]   Please contact Palliative Medicine Team phone at [phone] for questions and concerns.  For individual provider: See Shea Evans

## 2020-06-25 NOTE — [facility] Notes (Signed)
The patient is receiving Protonix by the intravenous route.  Based on criteria approved by the Pharmacy and [facility], the medication is being converted to the equivalent oral dose form.  These criteria include: -No active GI bleeding -Able to tolerate diet of full liquids (or better) or tube feeding -Able to tolerate other medications by the oral or enteral route  If you have any questions about this conversion, please contact the Pharmacy Department (phone [phone]).  Thank you.  Minda Ditto PharmD 06/25/2020, 11:18 AM

## 2020-06-25 NOTE — TOC Initial Note (Signed)
Transition of Care [facility]) - Initial/Assessment Note    Patient Details  Name: Sheri Miller MRN: [id number] Date of Birth: 09-Oct-1923  Transition of Care [facility]) CM/SW Contact:    Lynnell Catalan, RN Phone Number: 06/25/2020, 12:51 PM  Clinical Narrative:                 [facility] consult for home hospice services. Met with pt, son and DIL at the bedside for dc planning. Choice offered for home hospice and [facility] chosen. Family is requesting hospital bed, BSC and over bed table. [facility] liaison contacted for referral and above DME was requested. MOST form is on the chart and should go home with the patient. Plan is for pt to dc via private vehicle. Family states they have a wheelchair to get the pt in the house.  Expected Discharge Plan: Home w Hospice Care Barriers to Discharge: Continued Medical Work up   Patient Goals and CMS Choice Patient states their goals for this hospitalization and ongoing recovery are:: Pt is confused CMS Medicare.gov Compare Post Acute Care list provided to:: Patient Represenative (must comment) (Son and DIL) Choice offered to / list presented to : Adult Children  Expected Discharge Plan and Services Expected Discharge Plan: [place]   Discharge Planning Services: CM Consult Post Acute Care Choice: Hospice Living arrangements for the past 2 months: [place] Arranged: Disease Management [place] Agency: Hospice and [place] Date [place]: 06/25/20 Time HH Agency Contacted: 1250 Representative spoke with at [place]: [place] Arrangements/Services Living arrangements for the past 2 months: [place] with:: Adult Children Patient language and need for interpreter reviewed:: Yes Do you feel safe going back to the place where you live?: Yes      Need for Family Participation in Patient Care: Yes (Comment) Care giver support system in place?: Yes  (comment)   Criminal Activity/Legal Involvement Pertinent to Current Situation/Hospitalization: No - Comment as needed  Activities of Daily Living Home Assistive Devices/Equipment: Cane (specify quad or straight),Eyeglasses,Hearing aid,Wheelchair (bilateral hearing aides) ADL Screening (condition at time of admission) Patient's cognitive ability adequate to safely complete daily activities?: No Is the patient deaf or have difficulty hearing?: Yes (wears bilateral hearing aides) Does the patient have difficulty seeing, even when wearing glasses/contacts?: Yes (legally blind) Does the patient have difficulty concentrating, remembering, or making decisions?: Yes Patient able to express need for assistance with ADLs?: Yes Does the patient have difficulty dressing or bathing?: Yes Independently performs ADLs?: No Communication: Independent Dressing (OT): Needs assistance Is this a change from baseline?: Pre-admission baseline Grooming: Needs assistance Is this a change from baseline?: Pre-admission baseline Feeding: Needs assistance Is this a change from baseline?: Pre-admission baseline Bathing: Needs assistance Is this a change from baseline?: Pre-admission baseline Toileting: Needs assistance Is this a change from baseline?: Pre-admission baseline In/Out Bed: Needs assistance Is this a change from baseline?: Pre-admission baseline Walks in Home: Needs assistance Is this a change from baseline?: Pre-admission baseline Does the patient have difficulty walking or climbing stairs?: Yes Weakness of Legs: Both Weakness of Arms/Hands: Both  Permission Sought/Granted Permission sought to share information with : Facility Art therapist granted to share information with : Yes, Verbal Permission Granted     Permission granted to share info w AGENCY: [facility]  Emotional Assessment Appearance:: Appears stated age Attitude/Demeanor/Rapport: Gracious Affect  (typically observed): Quiet Orientation: : Fluctuating Orientation (Suspected and/or reported Sundowners) Alcohol / Substance Use: Not Applicable    Admission diagnosis:  Metastatic cancer [facility]) [C79.9] Patient Active Problem List   Diagnosis Date Noted  . Metastatic cancer ([place]) 06/24/2020  . Hyperkalemia 06/24/2020  . AKI (acute kidney injury) ([place]) 06/24/2020  . Hiatal hernia 06/24/2020  . Benign essential HTN 06/24/2020  . Dementia ([place]) 10/24/2018   PCP:  Wendie Agreste, MD Pharmacy:   [facility] [place], [address] [place] [address] Phone: [phone] Fax: [phone]     Social Determinants of Health (SDOH) Interventions    Readmission Risk Interventions Readmission Risk Prevention Plan 06/25/2020  Transportation Screening Complete  PCP or Specialist Appt within 3-5 Days Complete  HRI or [place] Complete  Social Work Consult for [place] Planning/Counseling Complete  Palliative Care Screening Complete  Medication Review Press photographer) Complete

## 2020-06-25 NOTE — Evaluation (Signed)
Physical Therapy Evaluation Patient Details Name: Sheri Miller MRN: [id number] DOB: 02-25-1924 Today's Date: 06/25/2020   History of Present Illness  Sheri Miller is a 87 y.o. female with past medical history of dementia, hypertension, hyperlipidemia, hypothyroid, who presents today for evaluation of lab abnormalities, poor appetite, fell from  a chair onto right hip.  Clinical Impression  The patient is pleasantly confused. Son present to provide information about patient's functional level.  Patient aroused and assisted from bed to recliner, requiring 2 mod assistance, posterior bias in standing. Patient with noted right hip bruising(Fall from chair PTA by report). Patient's family meeting with Palliative care team. GOC will determine recommendations from PT standpoint. Pt admitted with above diagnosis.  Pt currently with functional limitations due to the deficits listed below (see PT Problem List). Pt will benefit from skilled PT to increase their independence and safety with mobility to allow discharge to the venue listed below.     Follow Up Recommendations  (TBD) will require 24/7 caregivers    Equipment Recommendations  None recommended by PT    Recommendations for Other Services       Precautions / Restrictions Precautions Precautions: Fall      Mobility  Bed Mobility Overal bed mobility: Needs Assistance Bed Mobility: Rolling;Sidelying to Sit Rolling: Mod assist;Max assist Sidelying to sit: Max assist       General bed mobility comments: multimodal cues to roll, reach for rail/hand of therapist. Required max assistance to sit upright, bed pad used for upper body, Scoot to bed edge.    Transfers Overall transfer level: Needs assistance Equipment used: Rolling walker (2 wheeled) Transfers: Sit to/from [facility] Sit to Stand: Mod assist;+2 safety/equipment;+2 physical assistance Stand pivot transfers: +2 physical assistance;+2 safety/equipment;Mod  assist       General transfer comment: Assist to rise from bed, multimodal cue to hold RW. Noted leaning backwards onto bed, legs pressed against. Small shuffle steps to move to recliner. Cues to reach for recliner arms  Ambulation/Gait             General Gait Details: TBA  Stairs            Wheelchair Mobility    Modified Rankin (Stroke Patients Only)       Balance Overall balance assessment: History of Falls;Needs assistance Sitting-balance support: Feet supported;Bilateral upper extremity supported Sitting balance-Leahy Scale: Fair     Standing balance support: During functional activity;Bilateral upper extremity supported Standing balance-Leahy Scale: Poor Standing balance comment: support of Rw and therapist, posterior bias                             Pertinent Vitals/Pain Pain Assessment: No/denies pain    Home Living Family/patient expects to be discharged to:: Private residence Living Arrangements: Children Available Help at Discharge: Family;Available 24 hours/day Type of Home: House Home Access: Stairs to enter Entrance Stairs-Rails: None Entrance Stairs-Number of Steps: 3 Home Layout: One level Home Equipment: Cane - quad      Prior Function Level of Independence: Needs assistance   Gait / Transfers Assistance Needed: ubtil 1 week ago, patient ambulated in home independnetly, pushed a WC outside with son  supervision  ADL's / Homemaking Assistance Needed: recently requires assistance, 1 week ago was continent, copuld dress self. feed self        Hand Dominance        Extremity/Trunk Assessment        Lower Extremity  Assessment Lower Extremity Assessment: Generalized weakness    Cervical / Trunk Assessment Cervical / Trunk Assessment: Normal  Communication   Communication: No difficulties  Cognition Arousal/Alertness: Awake/alert Behavior During Therapy: WFL for tasks assessed/performed Overall Cognitive Status:  History of cognitive impairments - at baseline                                 General Comments: follows instructions with multimodal cues and imcreased time.Oriented to her son only      General Comments      Exercises     Assessment/Plan    PT Assessment Patient needs continued PT services  PT Problem List Decreased strength;Decreased mobility;Decreased safety awareness;Decreased activity tolerance;Decreased cognition;Decreased balance;Decreased knowledge of use of DME       PT Treatment Interventions DME instruction;Therapeutic activities;Cognitive remediation;Gait training;Therapeutic exercise;Functional mobility training;Balance training    PT Goals (Current goals can be found in the Care Plan section)  Acute Rehab PT Goals Patient Stated Goal: per son, agreed with getting patient up to recliner. PT Goal Formulation: With family Time For Goal Achievement: 07/09/20 Potential to Achieve Goals: Fair    Frequency Min 2X/week   Barriers to discharge        Co-evaluation PT/OT/SLP Co-Evaluation/Treatment: Yes Reason for Co-Treatment: For patient/therapist safety PT goals addressed during session: Mobility/safety with mobility OT goals addressed during session: ADL's and self-care       AM-PAC PT "6 Clicks" Mobility  Outcome Measure Help needed turning from your back to your side while in a flat bed without using bedrails?: A Lot Help needed moving from lying on your back to sitting on the side of a flat bed without using bedrails?: A Lot Help needed moving to and from a bed to a chair (including a wheelchair)?: A Lot Help needed standing up from a chair using your arms (e.g., wheelchair or bedside chair)?: A Lot Help needed to walk in hospital room?: Total Help needed climbing 3-5 steps with a railing? : Total 6 Click Score: 10    End of Session Equipment Utilized During Treatment: Gait belt Activity Tolerance: Patient tolerated treatment  well Patient left: in chair;with call bell/phone within reach;with chair alarm set;with family/visitor present Nurse Communication: Mobility status PT Visit Diagnosis: Unsteadiness on feet (R26.81);Difficulty in walking, not elsewhere classified (R26.2)    Time: 5465-6812 PT Time Calculation (min) (ACUTE ONLY): 18 min   Charges:              Tresa Endo PT Acute Rehabilitation Services Pager [phone] Office [phone]   Claretha Cooper 06/25/2020, 12:43 PM

## 2020-06-25 NOTE — Progress Notes (Signed)
Initial Nutrition Assessment  INTERVENTION:   -Ensure Enlive po BID, each supplement provides 350 kcal and 20 grams of protein  NUTRITION DIAGNOSIS:   Inadequate oral intake related to poor appetite,lethargy/confusion as evidenced by per patient/family report.  GOAL:   Patient will meet greater than or equal to 90% of their needs  MONITOR:   PO intake,Supplement acceptance,Labs,Weight trends,I & O's, GOC  REASON FOR ASSESSMENT:   Consult Assessment of nutrition requirement/status  ASSESSMENT:   87 y.o. female with medical history significant for dementia, hypertension on amlodipine/lisinopril, depression on Lexapro, Depakote, hypothyroidism, hyperlipidemia seen by the ED due to abnormal lab with renal failure and hyperkalemia.  Patient is a poor historian.  As per the report patient has had reduced appetite for a week, not eating well this bite family encouragement and has been very fatigued but no fever nausea vomiting. CT showing possible extensive metastatic cancer.   Patient alert/oriented x 1. Per family pt has not been eating well for the past week. Prior to this appetite change, pt was consuming small frequent meals. Will order Ensure supplements for additional kcals and protein.  Pt having palliative care meeting today. Per chart review, pt expected to discharge home with hospice now.   Admission weight: 122 lbs.  Medications: Folic acid, Multivitamin with minerals daily, Lovaza, Thiamine, Lactated ringers  Labs reviewed: Low Na  NUTRITION - FOCUSED PHYSICAL EXAM:  Deferred.  Diet Order:   Diet Order            DIET SOFT Room service appropriate? Yes; Fluid consistency: Thin  Diet effective now                 EDUCATION NEEDS:   No education needs have been identified at this time  Skin:  Skin Assessment: Reviewed RN Assessment  Last BM:  3/17 -type 4  Height:   Ht Readings from Last 1 Encounters:  06/24/20 5\' 1"  (1.549 m)    Weight:   Wt  Readings from Last 1 Encounters:  06/24/20 55.3 kg   BMI:  Body mass index is 23.05 kg/m.  Estimated Nutritional Needs:   Kcal:  1350-1550  Protein:  65-75g  Fluid:  1.5L/day  Clayton Bibles, MS, RD, LDN Inpatient Clinical Dietitian Contact information available via Amion

## 2020-06-25 NOTE — Progress Notes (Signed)
EPIC Template:  New Hospice at Home Referral Note  [facility])  Received request from [facility] for hospice services at home after discharge.  Chart and pt information under review by [facility] physician.  Hospice eligibility pending at this time.  [place] spoke with pt's daughter-in-law, [name], to initiate education related to hospice philosophy and services and to answer any questions at this time.  [name]     verbalized understanding of information given.  Per discussion the plan is to discharge home tomorrow by private vehicle.    [place] send signed and completed DNR home with pt/family.  Please provide prescriptions at discharge as needed to ensure ongoing symptom management until patient can be admitted onto hospice services.    DME needs discussed. Patient will need a hospital bed, overbed table and bedside commode.   Address has been verified and is correct in the chart.  [facility] information and contact numbers given to [place].  Above information shared with Jorene Guest Manager.  Please call with any questions or concerns.  Thank you for the opportunity to participate in this pt's care.  Domenic Moras, BSN, RN [employer] [phone] (24h on call)

## 2020-06-25 NOTE — Progress Notes (Signed)
PROGRESS NOTE    Sheri Miller  ZOX:[id number] DOB: [birth year] DOA: 06/24/2020 PCP: Wendie Agreste, MD   Chief Complaint  Patient presents with  . Fall  . abnormal labs  Brief Narrative:  87 y.o. female with medical history significant for dementia, hypertension on amlodipine/lisinopril, depression on Lexapro, Depakote, hypothyroidism, hyperlipidemia seen by the ED due to abnormal lab with renal failure and hyperkalemia.  Patient is a poor historian. As per the report patient has had reduced appetite for a week, not eating well this bite family encouragement and has been very fatigued but no fever nausea vomiting.  She was taken to PCP 3/15 routine labs were drawn and sent home.  She also had a recent fall from the chair.  Today PCP called her son requesting to take her to ER due to hyperkalemia renal failure and abnormal LFTs.  ED Course: Blood pressure is stable in 120s to 150s, saturating well on room air but presently bradycardic in 40s respiration 18.  Blood work showed improving hyperkalemia 6.2 > 5.4, renal failure 1.86> 1.68, hyponatremia 811> 914, metabolic acidosis bicarb 21 elevated alkaline phosphatase abnormal AST/ALT 139/64, UA showed RBC more than 50 WBC 11-20 nitrite leukocytes negative, chest x-ray atelectasis versus infiltrate left lower lobe subsequently underwent CT chest abdomen pelvis without IV contrast that showed significant finding " concerning for multifocal urothelial neoplasm, right breast mass suspicious for breast cancer with multiple lesions right left hepatic lobe-involving every hepatic subsegment-consistent with metastatic disease, also showed large hiatal hernia with nearly the entire stomach herniated into the chest, trace fluid in the pelvis, cholelithiasis, three-vessel coronary artery disease, healed b/l rib fractures. 250 mill bolus normal saline was given, blood culture was sent and admission was requested   Patient was admitted with DNR and further  palliative care evaluation Patient family elected not to proceed with any oncological work-up at this time  Subjective: Seen and examined, family member including son who is a POA is at the bedside   Assessment & Plan: Metastatic cancer to liver from Primary bladder malignancy and/or breast malignancy Multifocal urothelial neoplasm w/ hematuria Firm non tender Rt breast mass likely breast malignancy: CT findings concerning for metastatic cancer as above.  Palliative care consulted patient is DNR.  For patient's/family's wishes no further oncology work-up and plan for home with hospice given complex comorbidities, advanced age, with poor po intake/hiatal hernia with entire stomach in chest.  Hyperkalemia:  Resolved.    Hyponatremia/Dehydration:prerenal from poor intake vs from [facility]. Sodium improving continue IV fluid hydration.  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  Creatinine is improving continue IV hydration for now.  Encourage p.o.   Dementia/Depression on Lexapro:  Continue delirium precaution supportive care fall precaution.    Hiatal hernia with CT scan showing entire stomach in the chest, monitor at risk of aspiration.  Continue soft diet, continue Protonix.    Benign essential HTN on amlodipine/lisinopril will be held for now.  Blood pressure pressure stable  Bradycardia  heart rate in 70s  this morning.  D/C telemetry  Hypothyroidism: Recent TSH slightly elevated but T4 was normal.  Continue home Synthroid  Hyperlipidemia continue home Lovaza   Failure to thrive/falls/deconditioning:  Supportive measures palliative care.  Diet Order            DIET SOFT Room service appropriate? Yes; Fluid consistency: Thin  Diet effective now  Patient's Body mass index is 23.05 kg/m. DVT prophylaxis: heparin injection 5,000 Units Start: 06/24/20  2200 Code Status:   Code Status: DNR  Family Communication: plan of care discussed with patient at bedside.  Status is: Inpatient Remains inpatient appropriate because:IV treatments appropriate due to intensity of illness or inability to take PO and Inpatient level of care appropriate due to severity of illness  Dispo: The patient is from: Home              Anticipated d/c is to: Home with hospice tomorrow              Patient currently is not medically stable to d/c.   Difficult to place patient No       Unresulted Labs (From admission, onward)          Start     Ordered   06/25/20 0500  Comprehensive metabolic panel  Daily,   R      06/24/20 1518   06/24/20 0937  Urine culture  ONCE - STAT,   STAT        06/24/20 0938         Medications reviewed:  Scheduled Meds: . diphenhydrAMINE  25 mg Oral QHS   And  . acetaminophen  500 mg Oral QHS  . escitalopram  10 mg Oral QHS  . folic acid  1 mg Oral Daily  . heparin  5,000 Units Subcutaneous Q8H  . latanoprost  1 drop Both Eyes QHS  . levothyroxine  75 mcg Oral QAC breakfast  . multivitamin with minerals  1 tablet Oral Daily  . omega-3 acid ethyl esters  1 g Oral Daily  . pantoprazole  40 mg Oral Daily  . thiamine  100 mg Oral Daily   Continuous Infusions: . lactated ringers 75 mL/hr at 06/25/20 0809    Consultants:see note  Procedures:see note  Antimicrobials: Anti-infectives (From admission, onward)   None     Culture/Microbiology    Component Value Date/Time   SDES  06/24/2020 1031    BLOOD LEFT ANTECUBITAL Performed at [facility], [place] [address], [place], [place] [address]    [place]  06/24/2020 1031    BOTTLES DRAWN AEROBIC AND ANAEROBIC Blood Culture adequate volume Performed at [facility] [address], [place],  [address]    CULT  06/24/2020 1031    NO GROWTH < 24 HOURS Performed at [facility] [address] [place],   [address]    REPTSTATUS PENDING 06/24/2020 1031    Other culture-see note  Objective: Vitals: Today's Vitals   06/24/20 1530 06/24/20 1621 06/24/20 2041 06/25/20 0542  BP:  (!) 172/63 (!) 176/82 (!) 155/97  Pulse: (!) 58 (!) 48 73 78  Resp:  16 16   Temp:  97.9 F (36.6 C) 97.7 F (36.5 C) (!) 97.4 F (36.3 C)  TempSrc:  Oral Oral Oral  SpO2: 96% 97% 97% 94%  Weight:      Height:        Intake/Output Summary (Last 24 hours) at 06/25/2020 1317 Last data filed at 06/25/2020 0541 Gross per 24 hour  Intake 759.34 ml  Output 800 ml  Net -40.66 ml   Filed Weights   06/24/20 0956  Weight: 55.3 kg   Weight change:   Intake/Output from previous day: 03/16 0701 - 03/17 0700 In: 759.3 [I.V.:759.3] Out: 800 [Urine:800] Intake/Output this shift: No intake/output data recorded. Filed Weights   06/24/20 0956  Weight:  55.3 kg    Examination: General exam: AAOx1-2, pleasantly confused with dementia, not in pain HEENT:Oral mucosa moist, Ear/Nose WNL grossly,dentition normal. Respiratory system: bilaterally diminished,no use of accessory muscle, non tender. Cardiovascular system: S1 & S2 +, regular, No JVD. Gastrointestinal system: Abdomen soft, NT,ND, BS+. Nervous System:Alert, awake, moving extremities and grossly nonfocal Extremities: No edema, distal peripheral pulses palpable.  Skin: No rashes,no icterus.  Right breast mass MSK: Normal muscle bulk,tone, power  Data Reviewed: I have personally reviewed following labs and imaging studies CBC: Recent Labs  Lab 06/23/20 1041 06/24/20 1027 06/25/20 0619  WBC 8.0 6.0 7.5  NEUTROABS 4.9 3.9  --   HGB 13.5 12.9 12.5  HCT 41.3 39.3 38.8  MCV 85 86.0 86.2  PLT 424 359 174   Basic Metabolic Panel: Recent Labs  Lab 06/23/20 1041 06/24/20 1027 06/25/20 0619  NA 128* 125* 128*  K 6.2* 5.4* 4.6  CL 92* 95* 99  CO2 19* 21* 18*  GLUCOSE 85 95 90  BUN 31 36* 30*  CREATININE 1.86* 1.68* 1.37*  CALCIUM 9.7 9.0 8.8*  MG   --  2.1  --   PHOS  --  3.5  --    GFR: Estimated Creatinine Clearance: 18.9 mL/min (A) (by C-G formula based on SCr of 1.37 mg/dL (H)). Liver Function Tests: Recent Labs  Lab 06/23/20 1041 06/24/20 1027 06/25/20 0619  AST 176* 139* 126*  ALT 75* 64* 52*  ALKPHOS 393* 287* 256*  BILITOT 0.8 1.0 1.2  PROT 7.7 6.9 6.9  ALBUMIN 3.9 3.0* 3.0*   Recent Labs  Lab 06/24/20 1027  LIPASE 44   Recent Labs  Lab 06/24/20 1027  AMMONIA 26   Coagulation Profile: Recent Labs  Lab 06/25/20 0619  INR 1.2   Cardiac Enzymes: No results for input(s): CKTOTAL, CKMB, CKMBINDEX, TROPONINI in the last 168 hours. BNP (last 3 results) No results for input(s): PROBNP in the last 8760 hours. HbA1C: No results for input(s): HGBA1C in the last 72 hours. CBG: No results for input(s): GLUCAP in the last 168 hours. Lipid Profile: No results for input(s): CHOL, HDL, LDLCALC, TRIG, CHOLHDL, LDLDIRECT in the last 72 hours. Thyroid Function Tests: Recent Labs    06/23/20 1041  TSH 6.860*  T4TOTAL 6.4   Anemia Panel: Recent Labs    06/23/20 1041  VITAMINB12 >2000*   Sepsis Labs: Recent Labs  Lab 06/24/20 1031  LATICACIDVEN 1.4    Recent Results (from the past 240 hour(s))  Urine Culture     Status: None   Collection Time: 06/23/20 10:40 AM   Specimen: Urine   UR  Result Value Ref Range Status   Urine Culture, Routine Final report  Final   Organism ID, Bacteria Comment  Final    Comment: Mixed urogenital flora Less than 10,000 colonies/mL   Resp Panel by RT-PCR (Flu A&B, Covid) Nasopharyngeal Swab     Status: None   Collection Time: 06/24/20 10:27 AM   Specimen: Nasopharyngeal Swab; Nasopharyngeal(NP) swabs in vial transport medium  Result Value Ref Range Status   SARS Coronavirus 2 by RT PCR NEGATIVE NEGATIVE Final    Comment: (NOTE) SARS-CoV-2 target nucleic acids are NOT DETECTED.  The SARS-CoV-2 RNA is generally detectable in upper respiratory specimens during the  acute phase of infection. The lowest concentration of SARS-CoV-2 viral copies this assay can detect is 138 copies/mL. A negative result does not preclude SARS-Cov-2 infection and should not be used as the sole basis for treatment or other  patient management decisions. A negative result may occur with  improper specimen collection/handling, submission of specimen other than nasopharyngeal swab, presence of viral mutation(s) within the areas targeted by this assay, and inadequate number of viral copies(<138 copies/mL). A negative result must be combined with clinical observations, patient history, and epidemiological information. The expected result is Negative.  Fact Sheet for Patients:  EntrepreneurPulse.com.au  Fact Sheet for Healthcare Providers:  IncredibleEmployment.be  This test is no t yet approved or cleared by the [place] FDA and  has been authorized for detection and/or diagnosis of SARS-CoV-2 by FDA under an Emergency Use Authorization (EUA). This EUA will remain  in effect (meaning this test can be used) for the duration of the COVID-19 declaration under Section 564(b)(1) of the Act, 21 U.S.C.section 360bbb-3(b)(1), unless the authorization is terminated  or revoked sooner.       Influenza A by PCR NEGATIVE NEGATIVE Final   Influenza B by PCR NEGATIVE NEGATIVE Final    Comment: (NOTE) The Xpert Xpress SARS-CoV-2/FLU/RSV plus assay is intended as an aid in the diagnosis of influenza from Nasopharyngeal swab specimens and should not be used as a sole basis for treatment. Nasal washings and aspirates are unacceptable for Xpert Xpress SARS-CoV-2/FLU/RSV testing.  Fact Sheet for Patients: EntrepreneurPulse.com.au  Fact Sheet for Healthcare Providers: IncredibleEmployment.be  This test is not yet approved or cleared by the [place] FDA and has been authorized for detection and/or  diagnosis of SARS-CoV-2 by FDA under an Emergency Use Authorization (EUA). This EUA will remain in effect (meaning this test can be used) for the duration of the COVID-19 declaration under Section 564(b)(1) of the Act, 21 U.S.C. section 360bbb-3(b)(1), unless the authorization is terminated or revoked.  Performed at [facility], [place] [address], [place], [place] [address]   Culture, blood (routine x 2)     Status: None (Preliminary result)   Collection Time: 06/24/20 10:31 AM   Specimen: BLOOD  Result Value Ref Range Status   Specimen Description   Final    BLOOD LEFT ANTECUBITAL Performed at [facility] [address] [place], [place] [address]    Special Requests   Final    BOTTLES DRAWN AEROBIC AND ANAEROBIC Blood Culture adequate volume Performed at [facility] [address], [place], [place] [address]    Culture   Final    NO GROWTH < 24 HOURS Performed at [facility] [address] [place],  [address]    Report Status PENDING  Incomplete     Radiology Studies: CT ABDOMEN PELVIS WO CONTRAST  Result Date: 06/24/2020 CLINICAL DATA:  Epigastric pain with poor appetite. EXAM: CT CHEST, ABDOMEN AND PELVIS WITHOUT CONTRAST TECHNIQUE: Multidetector CT imaging of the chest, abdomen and pelvis was performed following the standard protocol without IV contrast. COMPARISON:  Only plain film comparison is are available. FINDINGS: CT CHEST FINDINGS Cardiovascular: Calcified atheromatous plaque in the thoracic aorta. No aneurysmal dilation. Three-vessel coronary artery disease. Central pulmonary vasculature grossly unremarkable on noncontrast imaging. Heart size normal. Mitral annular calcifications. No pericardial effusion. Mediastinum/Nodes: Thoracic inlet structures are normal. No axillary lymphadenopathy. RIGHT breast mass like area measuring 2.9 x 2.6 cm (image 19, series 2) No thoracic inlet adenopathy. No  hilar adenopathy. No mediastinal lymphadenopathy. Mildly patulous esophagus in the setting of large hiatal hernia with nearly the entire stomach herniated into the chest. Lungs/Pleura: Airways are patent. No consolidation. No pleural effusion. Musculoskeletal: See below for full musculoskeletal details.  Spinal degenerative changes. Evidence of prior trauma to the RIGHT and LEFT chest with healed rib fractures bilaterally, some subacute and some chronic. CT ABDOMEN PELVIS FINDINGS Hepatobiliary: Diffuse infiltration of the liver with multiple lesions present throughout the RIGHT and LEFT hepatic lobe. Largest in the central liver measuring approximately 4.6 x 4.2 cm on image 48 of series 2. Multiple additional smaller lesions involving nearly all of the visible hepatic parenchyma, every hepatic subsegment is involved. Another large lesion is seen in the inferior RIGHT hepatic lobe, hepatic subsegment VI (image 66, series 2) 3.8 x 3.6 cm. Cholelithiasis in a collapsed gallbladder. Numerous gallstones fill the lumen. These are in the 1-1.2 cm range. Pancreas: Pancreatic atrophy without signs of inflammation pancreatic bed. Spleen: Spleen normal size and contour. Adrenals/Urinary Tract: Adrenal glands are normal. RIGHT renal cyst. This measures 6.8 x 3.5 cm, I uniformly water dense. No hydronephrosis. Cortical cyst on the LEFT extending into the renal sinus moderate size, smaller than the contralateral cyst. Numerous areas of nodularity throughout the urinary bladder, for instance on image 95 of series 2 a 2.9 x 0.6 cm lesion is present in the bladder with involvement of the anterior bladder, contiguous with smaller areas of nodularity throughout the anterior bladder. Bilateral lesions along the lateral aspects of the urinary bladder and an additional lesion at 1.4 x 1.1 cm in the dome of the urinary bladder. Stomach/Bowel: Nearly the entire stomach is herniated into the chest. No perigastric stranding. No bowel  obstruction. Normal appendix. Colonic diverticulosis. Stool fills much of the colon. Vascular/Lymphatic: Calcified atheromatous plaque in the abdominal aorta. No aneurysmal dilation. There is no gastrohepatic or hepatoduodenal ligament lymphadenopathy. No retroperitoneal or mesenteric lymphadenopathy. No pelvic sidewall lymphadenopathy. Reproductive: Post hysterectomy. No adnexal masses. Trace fluid in the pelvis adjacent to RIGHT adnexa measures simple fluid approximately 2.1 cm greatest dimension. Other: No ascites.  No free air. Musculoskeletal: Visualized clavicles and scapulae are intact. Sternum it is intact. Bilateral rib fractures, subacute and chronic. None with significant displacement. Spinal degenerative changes. No acute or destructive bone process. The LEFT lateral listhesis in the setting of degenerative change of L4 on L5 and curvature of the spine also likely related to degenerative change in the lumbar spine. IMPRESSION: 1. Numerous areas of nodularity throughout the urinary bladder with involvement of the anterior bladder, consistent with multifocal urothelial neoplasm. 2. RIGHT breast mass suspicious for breast cancer. 3. Multiple lesions throughout the RIGHT and LEFT hepatic lobe, every hepatic subsegment is involved. Findings are most consistent with metastatic disease. Differential considerations would include metastatic urothelial carcinoma or metastatic breast cancer given findings on the current study. Note that there is only mild enlargement of RIGHT axillary lymph nodes, largest approximately 8 mm, synchronous primaries are also considered. 4. Large hiatal hernia with nearly the entire stomach herniated into the chest. 5. Trace free fluid in the pelvis of uncertain significance perhaps related to underlying liver dysfunction in the setting of diffuse hepatic metastatic disease though there is no fluid elsewhere in the abdomen or pelvis. 6. Cholelithiasis without evidence of acute  cholecystitis. 7. Three-vessel coronary artery disease. 8. Evidence of prior trauma to the RIGHT and LEFT chest with healed rib fractures bilaterally, some subacute and chronic. 9. Aortic atherosclerosis. Aortic Atherosclerosis (ICD10-I70.0). Electronically Signed   By: Zetta Bills M.D.   On: 06/24/2020 13:43   CT Chest Wo Contrast  Result Date: 06/24/2020 CLINICAL DATA:  Epigastric pain with poor appetite. EXAM: CT CHEST, ABDOMEN AND PELVIS WITHOUT CONTRAST TECHNIQUE:  Multidetector CT imaging of the chest, abdomen and pelvis was performed following the standard protocol without IV contrast. COMPARISON:  Only plain film comparison is are available. FINDINGS: CT CHEST FINDINGS Cardiovascular: Calcified atheromatous plaque in the thoracic aorta. No aneurysmal dilation. Three-vessel coronary artery disease. Central pulmonary vasculature grossly unremarkable on noncontrast imaging. Heart size normal. Mitral annular calcifications. No pericardial effusion. Mediastinum/Nodes: Thoracic inlet structures are normal. No axillary lymphadenopathy. RIGHT breast mass like area measuring 2.9 x 2.6 cm (image 19, series 2) No thoracic inlet adenopathy. No hilar adenopathy. No mediastinal lymphadenopathy. Mildly patulous esophagus in the setting of large hiatal hernia with nearly the entire stomach herniated into the chest. Lungs/Pleura: Airways are patent. No consolidation. No pleural effusion. Musculoskeletal: See below for full musculoskeletal details. Spinal degenerative changes. Evidence of prior trauma to the RIGHT and LEFT chest with healed rib fractures bilaterally, some subacute and some chronic. CT ABDOMEN PELVIS FINDINGS Hepatobiliary: Diffuse infiltration of the liver with multiple lesions present throughout the RIGHT and LEFT hepatic lobe. Largest in the central liver measuring approximately 4.6 x 4.2 cm on image 48 of series 2. Multiple additional smaller lesions involving nearly all of the visible hepatic  parenchyma, every hepatic subsegment is involved. Another large lesion is seen in the inferior RIGHT hepatic lobe, hepatic subsegment VI (image 66, series 2) 3.8 x 3.6 cm. Cholelithiasis in a collapsed gallbladder. Numerous gallstones fill the lumen. These are in the 1-1.2 cm range. Pancreas: Pancreatic atrophy without signs of inflammation pancreatic bed. Spleen: Spleen normal size and contour. Adrenals/Urinary Tract: Adrenal glands are normal. RIGHT renal cyst. This measures 6.8 x 3.5 cm, I uniformly water dense. No hydronephrosis. Cortical cyst on the LEFT extending into the renal sinus moderate size, smaller than the contralateral cyst. Numerous areas of nodularity throughout the urinary bladder, for instance on image 95 of series 2 a 2.9 x 0.6 cm lesion is present in the bladder with involvement of the anterior bladder, contiguous with smaller areas of nodularity throughout the anterior bladder. Bilateral lesions along the lateral aspects of the urinary bladder and an additional lesion at 1.4 x 1.1 cm in the dome of the urinary bladder. Stomach/Bowel: Nearly the entire stomach is herniated into the chest. No perigastric stranding. No bowel obstruction. Normal appendix. Colonic diverticulosis. Stool fills much of the colon. Vascular/Lymphatic: Calcified atheromatous plaque in the abdominal aorta. No aneurysmal dilation. There is no gastrohepatic or hepatoduodenal ligament lymphadenopathy. No retroperitoneal or mesenteric lymphadenopathy. No pelvic sidewall lymphadenopathy. Reproductive: Post hysterectomy. No adnexal masses. Trace fluid in the pelvis adjacent to RIGHT adnexa measures simple fluid approximately 2.1 cm greatest dimension. Other: No ascites.  No free air. Musculoskeletal: Visualized clavicles and scapulae are intact. Sternum it is intact. Bilateral rib fractures, subacute and chronic. None with significant displacement. Spinal degenerative changes. No acute or destructive bone process. The LEFT  lateral listhesis in the setting of degenerative change of L4 on L5 and curvature of the spine also likely related to degenerative change in the lumbar spine. IMPRESSION: 1. Numerous areas of nodularity throughout the urinary bladder with involvement of the anterior bladder, consistent with multifocal urothelial neoplasm. 2. RIGHT breast mass suspicious for breast cancer. 3. Multiple lesions throughout the RIGHT and LEFT hepatic lobe, every hepatic subsegment is involved. Findings are most consistent with metastatic disease. Differential considerations would include metastatic urothelial carcinoma or metastatic breast cancer given findings on the current study. Note that there is only mild enlargement of RIGHT axillary lymph nodes, largest approximately 8 mm, synchronous  primaries are also considered. 4. Large hiatal hernia with nearly the entire stomach herniated into the chest. 5. Trace free fluid in the pelvis of uncertain significance perhaps related to underlying liver dysfunction in the setting of diffuse hepatic metastatic disease though there is no fluid elsewhere in the abdomen or pelvis. 6. Cholelithiasis without evidence of acute cholecystitis. 7. Three-vessel coronary artery disease. 8. Evidence of prior trauma to the RIGHT and LEFT chest with healed rib fractures bilaterally, some subacute and chronic. 9. Aortic atherosclerosis. Aortic Atherosclerosis (ICD10-I70.0). Electronically Signed   By: Zetta Bills M.D.   On: 06/24/2020 13:43   DG Chest Portable 1 View  Result Date: 06/24/2020 CLINICAL DATA:  Recent fall, decreased appetite, increased weakness, dementia, altered mental status, history hypertension EXAM: PORTABLE CHEST 1 VIEW COMPARISON:  Portable exam 1003 hours without priors for comparison FINDINGS: Borderline enlargement of cardiac silhouette. Mediastinal contours and pulmonary vascularity normal. Atherosclerotic calcification aorta. Atelectasis versus infiltrate at LEFT base. Remaining  lungs clear. No pleural effusion or pneumothorax. LEFT glenohumeral degenerative changes. IMPRESSION: Atelectasis versus infiltrate at LEFT lower lobe. Aortic Atherosclerosis (ICD10-I70.0). Electronically Signed   By: Lavonia Dana M.D.   On: 06/24/2020 10:20     LOS: 1 day   Antonieta Pert, MD [facility]  06/25/2020, 1:17 PM

## 2020-06-26 DIAGNOSIS — I10 Essential (primary) hypertension: Secondary | ICD-10-CM

## 2020-06-26 LAB — URINE CULTURE

## 2020-06-26 LAB — COMPREHENSIVE METABOLIC PANEL
ALT: 58 U/L — ABNORMAL HIGH (ref 0–44)
AST: 284 U/L — ABNORMAL HIGH (ref 15–41)
Albumin: 3.3 g/dL — ABNORMAL LOW (ref 3.5–5.0)
Alkaline Phosphatase: 279 U/L — ABNORMAL HIGH (ref 38–126)
Anion gap: 14 (ref 5–15)
BUN: 28 mg/dL — ABNORMAL HIGH (ref 8–23)
CO2: 18 mmol/L — ABNORMAL LOW (ref 22–32)
Calcium: 8.9 mg/dL (ref 8.9–10.3)
Chloride: 96 mmol/L — ABNORMAL LOW (ref 98–111)
Creatinine, Ser: 1.42 mg/dL — ABNORMAL HIGH (ref 0.44–1.00)
GFR, Estimated: 34 mL/min — ABNORMAL LOW (ref >60)
Glucose, Bld: 115 mg/dL — ABNORMAL HIGH (ref 70–99)
Potassium: 4.6 mmol/L (ref 3.5–5.1)
Sodium: 128 mmol/L — ABNORMAL LOW (ref 135–145)
Total Bilirubin: 1.7 mg/dL — ABNORMAL HIGH (ref 0.3–1.2)
Total Protein: 7.4 g/dL (ref 6.5–8.1)

## 2020-06-26 MED ORDER — PANTOPRAZOLE SODIUM 40 MG PO TBEC: 40.0000 mg | DELAYED_RELEASE_TABLET | Freq: Every day | ORAL | 0 refills | Status: AC

## 2020-06-26 MED ORDER — FOLIC ACID 1 MG PO TABS: 1.0000 mg | ORAL_TABLET | Freq: Every day | ORAL | 0 refills | Status: AC

## 2020-06-26 MED ORDER — ENSURE ENLIVE PO LIQD: 237.0000 mL | Freq: Two times a day (BID) | ORAL | 12 refills | Status: AC

## 2020-06-26 NOTE — Telephone Encounter (Signed)
[facility] is calling asking if you would be the pt's attending provider while the pt is in hospice. Please advise and ill get back to them.

## 2020-06-26 NOTE — Telephone Encounter (Signed)
Sheri Miller with [facility] calling back. Patient is discharged as of today and they want her to be evaluated for hospice     Please advise Sheri Miller  [phone]

## 2020-06-26 NOTE — Care Management Important Message (Signed)
Important Message  Patient Details IM Letter placed in Patient's room Name: Sheri Miller MRN: [id number] Date of Birth: 1924-03-09   Medicare Important Message Given:  Yes     Kerin Salen 06/26/2020, 9:09 AM

## 2020-06-26 NOTE — Plan of Care (Signed)

## 2020-06-26 NOTE — Discharge Summary (Signed)
Physician Discharge Summary  Sheri Miller MWN:[id number] DOB: Jun 30, 1923 DOA: 06/24/2020  PCP: Wendie Agreste, MD  Admit date: 06/24/2020 Discharge date: 06/26/2020  Admitted From: home Disposition: Home with hospice  Recommendations for Outpatient Follow-up:  1. Follow up hospice at home upon discharge  Home Health:no  Equipment/Devices: none  Discharge Condition: Stable Code Status:   Code Status: DNR Diet recommendation:  Diet Order            Diet general           DIET SOFT Room service appropriate? Yes; Fluid consistency: Thin  Diet effective now                  Brief/Interim Summary: 87 y.o.femalewith medical history significant fordementia, hypertension on amlodipine/lisinopril, depression on Lexapro, Depakote,hypothyroidism,hyperlipidemia seen by the ED due to abnormal lab with renal failure and hyperkalemia.Patient is a poor historian. As per the report patient has had reduced appetite for a week,not eating well this bite family encouragement and has been very fatigued but no fever nausea vomiting. She was taken to PCP 3/15 routine labs were drawn and sent home. She also had a recent fall from the chair. Today PCP called her son requesting to take her to ER due to hyperkalemia renal failure and abnormal LFTs.  ED Course:Blood pressure is stable in 120s to 150s, saturating well on room air but presently bradycardic in 40s respiration 18.Blood work showed improving hyperkalemia 6.2 > 5.4,renal failure1.86> 1.68, hyponatremia 403> 125,metabolic acidosis bicarb 21 elevated alkaline phosphatase abnormal AST/ALT 139/64,UA showed RBC more than 50 WBC 11-20 nitrite leukocytes negative,chest x-ray atelectasis versus infiltrate left lower lobe subsequently underwent CT chest abdomen pelvis without IV contrast that showed significant finding "concerning for multifocal urothelial neoplasm, right breast mass suspicious for breast cancer with multiple lesions right  left hepatic lobe-involving every hepatic subsegment-consistent with metastatic disease,also showed large hiatal hernia with nearly the entire stomach herniated into the chest,trace fluid in the pelvis, cholelithiasis, three-vessel coronary artery disease,healed b/l rib fractures. 250 mill bolus normal saline was given,blood culture was sent and admission was requested   Patient was admitted with DNR and further palliative care evaluation Patient family elected not to proceed with any oncological work-up at this time Seen by palliative care. Patient is not anything acute distress pleasantly confused at this time plan is for her to go home with hospice which is being set up by social worker palliative care.  Discharge Diagnoses:   Metastatic cancerto liverfromPrimarybladder malignancyand/orbreast malignancy Multifocal urothelial neoplasm w/ hematuria Firm non tender Rt breast mass likely breast malignancy: CT findings concerning for metastatic cancer as above.  Palliative care consult to discuss goals of care.after extensive discussion POA /family elected no further oncology work-up and plan for home with hospice given complex comorbidities, advanced age, with poor po intake/hiatal hernia with entire stomach in chest. Overall prognosis remains poor.  Continue supportive measures nutrition supplement Protonix at home.  Fortunately she is not in pain and is demented and does not know what is going on  Hyperkalemia: -year-old   Hyponatremia/Dehydration:prerenal from poor intake vs from [facility].  Sodium did come up somewhat 128 suspect component of SIADH stop IV fluids.  AKIon CKD stage III/IV,metabolic acidosis with bicarbonate 18; Baseline creatinine 1.5 03/18/2020: Discontinued lisinopril.  Overall poor prognosis hydrated orally at home, will discharge on home hospice.  Voiding here well. I/O last 3 completed shifts: In: 759.3 [I.V.:759.3] Out: 600 [Urine:600] No  intake/output data recorded.  Dementia/Depression on Lexapro: Continue  delirium precaution supportive care fall precaution.   Transaminitis likely from liver mets. Hiatal herniawith CT scan showing entire stomach in the chest,monitor at risk of aspiration.  Continue soft diet, PPI and supportive measures   Benign essential HTNdiscontinued lisinopril, may resume amlodipine at home.    Bradycardia  asymptomatic and improved.  Hypothyroidism:Recent TSH slightly elevated but T4 was normal.  Continue home Synthroid  Hyperlipidemiacontinue home Lovaza   Failure to thrive/falls/deconditioning: Supportive measures palliative care.  Consults:  Palliative care  Subjective: Pleasantly confused, Discharge Exam: Vitals:   06/25/20 2002 06/26/20 0542  BP: (!) 143/98 (!) 151/76  Pulse: 90 68  Resp: 16 16  Temp: (!) 97.4 F (36.3 C) 97.7 F (36.5 C)  SpO2: 94% 96%   General: Pt is alert, awake, pleasantly confused cardiovascular: RRR, S1/S2 +, no rubs, no gallops Respiratory: CTA bilaterally, no wheezing, no rhonchi Abdominal: Soft, NT, ND, bowel sounds + Extremities: no edema, no cyanosis  Discharge Instructions  Discharge Instructions    Diet general   Complete by: As directed    Discharge instructions   Complete by: As directed    Follow-up with the hospice at home     Allergies as of 06/26/2020   No Known Allergies     Medication List    STOP taking these medications   divalproex 125 MG capsule Commonly known as: DEPAKOTE SPRINKLE   lisinopril 20 MG tablet Commonly known as: ZESTRIL     TAKE these medications   amLODipine 2.5 MG tablet Commonly known as: NORVASC Take 1 tablet (2.5 mg total) by mouth daily.   diphenhydramine-acetaminophen 25-500 MG Tabs tablet Commonly known as: TYLENOL PM Take 1 tablet by mouth at bedtime.   escitalopram 10 MG tablet Commonly known as: LEXAPRO TAKE 1 TABLET(10 MG) BY MOUTH AT BEDTIME What changed:   how much  to take  how to take this  when to take this  additional instructions   feeding supplement Liqd Take 237 mLs by mouth 2 (two) times daily between meals.   folic acid 1 MG tablet Commonly known as: FOLVITE Take 1 tablet (1 mg total) by mouth daily. Start taking on: June 27, 2020   latanoprost 0.005 % ophthalmic solution Commonly known as: XALATAN INSTILL 1 DROP IN BOTH EYES AT BEDTIME What changed: See the new instructions.   levothyroxine 75 MCG tablet Commonly known as: SYNTHROID Take 1 tablet (75 mcg total) by mouth daily before breakfast.   omega-3 acid ethyl esters 1 g capsule Commonly known as: LOVAZA Take 1 g by mouth daily.   pantoprazole 40 MG tablet Commonly known as: PROTONIX Take 1 tablet (40 mg total) by mouth daily. Start taking on: June 27, 2020   ULTRA WOMENS PACK PO Take 1 tablet by mouth daily.       Follow-up Information    Wendie Agreste, MD Follow up in 1 week(s).   Specialties: Family Medicine, Sports Medicine Contact information: [place] [address] [phone]              No Known Allergies  The results of significant diagnostics from this hospitalization (including imaging, microbiology, ancillary and laboratory) are listed below for reference.    Microbiology: Recent Results (from the past 240 hour(s))  Urine Culture     Status: None   Collection Time: 06/23/20 10:40 AM   Specimen: Urine   UR  Result Value Ref Range Status   Urine Culture, Routine Final report  Final   Organism  ID, Bacteria Comment  Final    Comment: Mixed urogenital flora Less than 10,000 colonies/mL   Urine culture     Status: Abnormal   Collection Time: 06/24/20  9:37 AM   Specimen: Urine, Random  Result Value Ref Range Status   Specimen Description   Final    URINE, RANDOM Performed at [facility] [address] [place], [place] [address]    Special Requests   Final    NONE Performed at [facility], [place] [address] [place], [place] [address]    Culture MULTIPLE SPECIES PRESENT, SUGGEST RECOLLECTION (A)  Final   Report Status 06/26/2020 FINAL  Final  Resp Panel by RT-PCR (Flu A&B, Covid) Nasopharyngeal Swab     Status: None   Collection Time: 06/24/20 10:27 AM   Specimen: Nasopharyngeal Swab; Nasopharyngeal(NP) swabs in vial transport medium  Result Value Ref Range Status   SARS Coronavirus 2 by RT PCR NEGATIVE NEGATIVE Final    Comment: (NOTE) SARS-CoV-2 target nucleic acids are NOT DETECTED.  The SARS-CoV-2 RNA is generally detectable in upper respiratory specimens during the acute phase of infection. The lowest concentration of SARS-CoV-2 viral copies this assay can detect is 138 copies/mL. A negative result does not preclude SARS-Cov-2 infection and should not be used as the sole basis for treatment or other patient management decisions. A negative result may occur with  improper specimen collection/handling, submission of specimen other than nasopharyngeal swab, presence of viral mutation(s) within the areas targeted by this assay, and inadequate number of viral copies(<138 copies/mL). A negative result must be combined with clinical observations, patient history, and epidemiological information. The expected result is Negative.  Fact Sheet for Patients:  EntrepreneurPulse.com.au  Fact Sheet for Healthcare Providers:  IncredibleEmployment.be  This test is no t yet approved or cleared by the [place] FDA and  has been authorized for detection and/or diagnosis of SARS-CoV-2 by FDA under an Emergency Use Authorization (EUA). This EUA will remain  in effect (meaning this test can be used) for the duration of the COVID-19 declaration under Section 564(b)(1) of the Act, 21 U.S.C.section 360bbb-3(b)(1), unless the authorization is terminated  or revoked sooner.       Influenza A by PCR NEGATIVE NEGATIVE Final    Influenza B by PCR NEGATIVE NEGATIVE Final    Comment: (NOTE) The Xpert Xpress SARS-CoV-2/FLU/RSV plus assay is intended as an aid in the diagnosis of influenza from Nasopharyngeal swab specimens and should not be used as a sole basis for treatment. Nasal washings and aspirates are unacceptable for Xpert Xpress SARS-CoV-2/FLU/RSV testing.  Fact Sheet for Patients: EntrepreneurPulse.com.au  Fact Sheet for Healthcare Providers: IncredibleEmployment.be  This test is not yet approved or cleared by the [place] FDA and has been authorized for detection and/or diagnosis of SARS-CoV-2 by FDA under an Emergency Use Authorization (EUA). This EUA will remain in effect (meaning this test can be used) for the duration of the COVID-19 declaration under Section 564(b)(1) of the Act, 21 U.S.C. section 360bbb-3(b)(1), unless the authorization is terminated or revoked.  Performed at [facility], [place] [address], [place], [place] [address]   Culture, blood (routine x 2)     Status: None (Preliminary result)   Collection Time: 06/24/20 10:31 AM   Specimen: BLOOD  Result Value Ref Range Status   Specimen Description   Final    BLOOD LEFT ANTECUBITAL Performed at [facility] [address], [place],  [address]  Special Requests   Final    BOTTLES DRAWN AEROBIC AND ANAEROBIC Blood Culture adequate volume Performed at [facility] [address] [place], [place] [address]    Culture   Final    NO GROWTH 2 DAYS Performed at [facility] [address] [place], [place] [address]    Report Status PENDING  Incomplete    Procedures/Studies: CT ABDOMEN PELVIS WO CONTRAST  Result Date: 06/24/2020 CLINICAL DATA:  Epigastric pain with poor appetite. EXAM: CT CHEST, ABDOMEN AND PELVIS WITHOUT CONTRAST TECHNIQUE: Multidetector CT imaging of the chest, abdomen and pelvis was performed  following the standard protocol without IV contrast. COMPARISON:  Only plain film comparison is are available. FINDINGS: CT CHEST FINDINGS Cardiovascular: Calcified atheromatous plaque in the thoracic aorta. No aneurysmal dilation. Three-vessel coronary artery disease. Central pulmonary vasculature grossly unremarkable on noncontrast imaging. Heart size normal. Mitral annular calcifications. No pericardial effusion. Mediastinum/Nodes: Thoracic inlet structures are normal. No axillary lymphadenopathy. RIGHT breast mass like area measuring 2.9 x 2.6 cm (image 19, series 2) No thoracic inlet adenopathy. No hilar adenopathy. No mediastinal lymphadenopathy. Mildly patulous esophagus in the setting of large hiatal hernia with nearly the entire stomach herniated into the chest. Lungs/Pleura: Airways are patent. No consolidation. No pleural effusion. Musculoskeletal: See below for full musculoskeletal details. Spinal degenerative changes. Evidence of prior trauma to the RIGHT and LEFT chest with healed rib fractures bilaterally, some subacute and some chronic. CT ABDOMEN PELVIS FINDINGS Hepatobiliary: Diffuse infiltration of the liver with multiple lesions present throughout the RIGHT and LEFT hepatic lobe. Largest in the central liver measuring approximately 4.6 x 4.2 cm on image 48 of series 2. Multiple additional smaller lesions involving nearly all of the visible hepatic parenchyma, every hepatic subsegment is involved. Another large lesion is seen in the inferior RIGHT hepatic lobe, hepatic subsegment VI (image 66, series 2) 3.8 x 3.6 cm. Cholelithiasis in a collapsed gallbladder. Numerous gallstones fill the lumen. These are in the 1-1.2 cm range. Pancreas: Pancreatic atrophy without signs of inflammation pancreatic bed. Spleen: Spleen normal size and contour. Adrenals/Urinary Tract: Adrenal glands are normal. RIGHT renal cyst. This measures 6.8 x 3.5 cm, I uniformly water dense. No hydronephrosis. Cortical cyst on  the LEFT extending into the renal sinus moderate size, smaller than the contralateral cyst. Numerous areas of nodularity throughout the urinary bladder, for instance on image 95 of series 2 a 2.9 x 0.6 cm lesion is present in the bladder with involvement of the anterior bladder, contiguous with smaller areas of nodularity throughout the anterior bladder. Bilateral lesions along the lateral aspects of the urinary bladder and an additional lesion at 1.4 x 1.1 cm in the dome of the urinary bladder. Stomach/Bowel: Nearly the entire stomach is herniated into the chest. No perigastric stranding. No bowel obstruction. Normal appendix. Colonic diverticulosis. Stool fills much of the colon. Vascular/Lymphatic: Calcified atheromatous plaque in the abdominal aorta. No aneurysmal dilation. There is no gastrohepatic or hepatoduodenal ligament lymphadenopathy. No retroperitoneal or mesenteric lymphadenopathy. No pelvic sidewall lymphadenopathy. Reproductive: Post hysterectomy. No adnexal masses. Trace fluid in the pelvis adjacent to RIGHT adnexa measures simple fluid approximately 2.1 cm greatest dimension. Other: No ascites.  No free air. Musculoskeletal: Visualized clavicles and scapulae are intact. Sternum it is intact. Bilateral rib fractures, subacute and chronic. None with significant displacement. Spinal degenerative changes. No acute or destructive bone process. The LEFT lateral listhesis in the setting of degenerative change of L4 on L5 and curvature of the spine  also likely related to degenerative change in the lumbar spine. IMPRESSION: 1. Numerous areas of nodularity throughout the urinary bladder with involvement of the anterior bladder, consistent with multifocal urothelial neoplasm. 2. RIGHT breast mass suspicious for breast cancer. 3. Multiple lesions throughout the RIGHT and LEFT hepatic lobe, every hepatic subsegment is involved. Findings are most consistent with metastatic disease. Differential considerations  would include metastatic urothelial carcinoma or metastatic breast cancer given findings on the current study. Note that there is only mild enlargement of RIGHT axillary lymph nodes, largest approximately 8 mm, synchronous primaries are also considered. 4. Large hiatal hernia with nearly the entire stomach herniated into the chest. 5. Trace free fluid in the pelvis of uncertain significance perhaps related to underlying liver dysfunction in the setting of diffuse hepatic metastatic disease though there is no fluid elsewhere in the abdomen or pelvis. 6. Cholelithiasis without evidence of acute cholecystitis. 7. Three-vessel coronary artery disease. 8. Evidence of prior trauma to the RIGHT and LEFT chest with healed rib fractures bilaterally, some subacute and chronic. 9. Aortic atherosclerosis. Aortic Atherosclerosis (ICD10-I70.0). Electronically Signed   By: Zetta Bills M.D.   On: 06/24/2020 13:43   CT Chest Wo Contrast  Result Date: 06/24/2020 CLINICAL DATA:  Epigastric pain with poor appetite. EXAM: CT CHEST, ABDOMEN AND PELVIS WITHOUT CONTRAST TECHNIQUE: Multidetector CT imaging of the chest, abdomen and pelvis was performed following the standard protocol without IV contrast. COMPARISON:  Only plain film comparison is are available. FINDINGS: CT CHEST FINDINGS Cardiovascular: Calcified atheromatous plaque in the thoracic aorta. No aneurysmal dilation. Three-vessel coronary artery disease. Central pulmonary vasculature grossly unremarkable on noncontrast imaging. Heart size normal. Mitral annular calcifications. No pericardial effusion. Mediastinum/Nodes: Thoracic inlet structures are normal. No axillary lymphadenopathy. RIGHT breast mass like area measuring 2.9 x 2.6 cm (image 19, series 2) No thoracic inlet adenopathy. No hilar adenopathy. No mediastinal lymphadenopathy. Mildly patulous esophagus in the setting of large hiatal hernia with nearly the entire stomach herniated into the chest. Lungs/Pleura:  Airways are patent. No consolidation. No pleural effusion. Musculoskeletal: See below for full musculoskeletal details. Spinal degenerative changes. Evidence of prior trauma to the RIGHT and LEFT chest with healed rib fractures bilaterally, some subacute and some chronic. CT ABDOMEN PELVIS FINDINGS Hepatobiliary: Diffuse infiltration of the liver with multiple lesions present throughout the RIGHT and LEFT hepatic lobe. Largest in the central liver measuring approximately 4.6 x 4.2 cm on image 48 of series 2. Multiple additional smaller lesions involving nearly all of the visible hepatic parenchyma, every hepatic subsegment is involved. Another large lesion is seen in the inferior RIGHT hepatic lobe, hepatic subsegment VI (image 66, series 2) 3.8 x 3.6 cm. Cholelithiasis in a collapsed gallbladder. Numerous gallstones fill the lumen. These are in the 1-1.2 cm range. Pancreas: Pancreatic atrophy without signs of inflammation pancreatic bed. Spleen: Spleen normal size and contour. Adrenals/Urinary Tract: Adrenal glands are normal. RIGHT renal cyst. This measures 6.8 x 3.5 cm, I uniformly water dense. No hydronephrosis. Cortical cyst on the LEFT extending into the renal sinus moderate size, smaller than the contralateral cyst. Numerous areas of nodularity throughout the urinary bladder, for instance on image 95 of series 2 a 2.9 x 0.6 cm lesion is present in the bladder with involvement of the anterior bladder, contiguous with smaller areas of nodularity throughout the anterior bladder. Bilateral lesions along the lateral aspects of the urinary bladder and an additional lesion at 1.4 x 1.1 cm in the dome of the urinary bladder. Stomach/Bowel: Nearly  the entire stomach is herniated into the chest. No perigastric stranding. No bowel obstruction. Normal appendix. Colonic diverticulosis. Stool fills much of the colon. Vascular/Lymphatic: Calcified atheromatous plaque in the abdominal aorta. No aneurysmal dilation. There is  no gastrohepatic or hepatoduodenal ligament lymphadenopathy. No retroperitoneal or mesenteric lymphadenopathy. No pelvic sidewall lymphadenopathy. Reproductive: Post hysterectomy. No adnexal masses. Trace fluid in the pelvis adjacent to RIGHT adnexa measures simple fluid approximately 2.1 cm greatest dimension. Other: No ascites.  No free air. Musculoskeletal: Visualized clavicles and scapulae are intact. Sternum it is intact. Bilateral rib fractures, subacute and chronic. None with significant displacement. Spinal degenerative changes. No acute or destructive bone process. The LEFT lateral listhesis in the setting of degenerative change of L4 on L5 and curvature of the spine also likely related to degenerative change in the lumbar spine. IMPRESSION: 1. Numerous areas of nodularity throughout the urinary bladder with involvement of the anterior bladder, consistent with multifocal urothelial neoplasm. 2. RIGHT breast mass suspicious for breast cancer. 3. Multiple lesions throughout the RIGHT and LEFT hepatic lobe, every hepatic subsegment is involved. Findings are most consistent with metastatic disease. Differential considerations would include metastatic urothelial carcinoma or metastatic breast cancer given findings on the current study. Note that there is only mild enlargement of RIGHT axillary lymph nodes, largest approximately 8 mm, synchronous primaries are also considered. 4. Large hiatal hernia with nearly the entire stomach herniated into the chest. 5. Trace free fluid in the pelvis of uncertain significance perhaps related to underlying liver dysfunction in the setting of diffuse hepatic metastatic disease though there is no fluid elsewhere in the abdomen or pelvis. 6. Cholelithiasis without evidence of acute cholecystitis. 7. Three-vessel coronary artery disease. 8. Evidence of prior trauma to the RIGHT and LEFT chest with healed rib fractures bilaterally, some subacute and chronic. 9. Aortic  atherosclerosis. Aortic Atherosclerosis (ICD10-I70.0). Electronically Signed   By: Zetta Bills M.D.   On: 06/24/2020 13:43   DG Chest Portable 1 View  Result Date: 06/24/2020 CLINICAL DATA:  Recent fall, decreased appetite, increased weakness, dementia, altered mental status, history hypertension EXAM: PORTABLE CHEST 1 VIEW COMPARISON:  Portable exam 1003 hours without priors for comparison FINDINGS: Borderline enlargement of cardiac silhouette. Mediastinal contours and pulmonary vascularity normal. Atherosclerotic calcification aorta. Atelectasis versus infiltrate at LEFT base. Remaining lungs clear. No pleural effusion or pneumothorax. LEFT glenohumeral degenerative changes. IMPRESSION: Atelectasis versus infiltrate at LEFT lower lobe. Aortic Atherosclerosis (ICD10-I70.0). Electronically Signed   By: Lavonia Dana M.D.   On: 06/24/2020 10:20     Labs: BNP (last 3 results) No results for input(s): BNP in the last 8760 hours. Basic Metabolic Panel: Recent Labs  Lab 06/23/20 1041 06/24/20 1027 06/25/20 0619 06/26/20 0524  NA 128* 125* 128* 128*  K 6.2* 5.4* 4.6 4.6  CL 92* 95* 99 96*  CO2 19* 21* 18* 18*  GLUCOSE 85 95 90 115*  BUN 31 36* 30* 28*  CREATININE 1.86* 1.68* 1.37* 1.42*  CALCIUM 9.7 9.0 8.8* 8.9  MG  --  2.1  --   --   PHOS  --  3.5  --   --    Liver Function Tests: Recent Labs  Lab 06/23/20 1041 06/24/20 1027 06/25/20 0619 06/26/20 0524  AST 176* 139* 126* 284*  ALT 75* 64* 52* 58*  ALKPHOS 393* 287* 256* 279*  BILITOT 0.8 1.0 1.2 1.7*  PROT 7.7 6.9 6.9 7.4  ALBUMIN 3.9 3.0* 3.0* 3.3*   Recent Labs  Lab 06/24/20 1027  LIPASE  44   Recent Labs  Lab 06/24/20 1027  AMMONIA 26   CBC: Recent Labs  Lab 06/23/20 1041 06/24/20 1027 06/25/20 0619  WBC 8.0 6.0 7.5  NEUTROABS 4.9 3.9  --   HGB 13.5 12.9 12.5  HCT 41.3 39.3 38.8  MCV 85 86.0 86.2  PLT 424 359 316   Cardiac Enzymes: No results for input(s): CKTOTAL, CKMB, CKMBINDEX, TROPONINI in the  last 168 hours. BNP: Invalid input(s): POCBNP CBG: No results for input(s): GLUCAP in the last 168 hours. D-Dimer No results for input(s): DDIMER in the last 72 hours. Hgb A1c No results for input(s): HGBA1C in the last 72 hours. Lipid Profile No results for input(s): CHOL, HDL, LDLCALC, TRIG, CHOLHDL, LDLDIRECT in the last 72 hours. Thyroid function studies No results for input(s): TSH, T4TOTAL, T3FREE, THYROIDAB in the last 72 hours.  Invalid input(s): FREET3 Anemia work up No results for input(s): VITAMINB12, FOLATE, FERRITIN, TIBC, IRON, RETICCTPCT in the last 72 hours. Urinalysis    Component Value Date/Time   COLORURINE YELLOW 06/24/2020 0937   APPEARANCEUR HAZY (A) 06/24/2020 0937   LABSPEC 1.006 06/24/2020 0937   PHURINE 5.0 06/24/2020 0937   GLUCOSEU NEGATIVE 06/24/2020 0937   HGBUR LARGE (A) 06/24/2020 0937   BILIRUBINUR NEGATIVE 06/24/2020 0937   BILIRUBINUR negative 04/18/2018 1154   KETONESUR NEGATIVE 06/24/2020 0937   PROTEINUR NEGATIVE 06/24/2020 0937   UROBILINOGEN 0.2 04/18/2018 1154   NITRITE NEGATIVE 06/24/2020 0937   LEUKOCYTESUR NEGATIVE 06/24/2020 5400   [facility] Invalid input(s): PROCALCITONIN,  WBC,  LACTICIDVEN Microbiology Recent Results (from the past 240 hour(s))  Urine Culture     Status: None   Collection Time: 06/23/20 10:40 AM   Specimen: Urine   UR  Result Value Ref Range Status   Urine Culture, Routine Final report  Final   Organism ID, Bacteria Comment  Final    Comment: Mixed urogenital flora Less than 10,000 colonies/mL   Urine culture     Status: Abnormal   Collection Time: 06/24/20  9:37 AM   Specimen: Urine, Random  Result Value Ref Range Status   Specimen Description   Final    URINE, RANDOM Performed at [facility], [place] [address] [place], [place] [address]    Special Requests   Final    NONE Performed at [facility], [place] [address] [place], [place] [address]    Culture  MULTIPLE SPECIES PRESENT, SUGGEST RECOLLECTION (A)  Final   Report Status 06/26/2020 FINAL  Final  Resp Panel by RT-PCR (Flu A&B, Covid) Nasopharyngeal Swab     Status: None   Collection Time: 06/24/20 10:27 AM   Specimen: Nasopharyngeal Swab; Nasopharyngeal(NP) swabs in vial transport medium  Result Value Ref Range Status   SARS Coronavirus 2 by RT PCR NEGATIVE NEGATIVE Final    Comment: (NOTE) SARS-CoV-2 target nucleic acids are NOT DETECTED.  The SARS-CoV-2 RNA is generally detectable in upper respiratory specimens during the acute phase of infection. The lowest concentration of SARS-CoV-2 viral copies this assay can detect is 138 copies/mL. A negative result does not preclude SARS-Cov-2 infection and should not be used as the sole basis for treatment or other patient management decisions. A negative result may occur with  improper specimen collection/handling, submission of specimen other than nasopharyngeal swab, presence of viral mutation(s) within the areas targeted by this assay, and inadequate number of viral copies(<138 copies/mL). A negative result must be combined with clinical observations, patient history, and epidemiological information. The expected  result is Negative.  Fact Sheet for Patients:  EntrepreneurPulse.com.au  Fact Sheet for Healthcare Providers:  IncredibleEmployment.be  This test is no t yet approved or cleared by the [place] FDA and  has been authorized for detection and/or diagnosis of SARS-CoV-2 by FDA under an Emergency Use Authorization (EUA). This EUA will remain  in effect (meaning this test can be used) for the duration of the COVID-19 declaration under Section 564(b)(1) of the Act, 21 U.S.C.section 360bbb-3(b)(1), unless the authorization is terminated  or revoked sooner.       Influenza A by PCR NEGATIVE NEGATIVE Final   Influenza B by PCR NEGATIVE NEGATIVE Final    Comment: (NOTE) The Xpert  Xpress SARS-CoV-2/FLU/RSV plus assay is intended as an aid in the diagnosis of influenza from Nasopharyngeal swab specimens and should not be used as a sole basis for treatment. Nasal washings and aspirates are unacceptable for Xpert Xpress SARS-CoV-2/FLU/RSV testing.  Fact Sheet for Patients: EntrepreneurPulse.com.au  Fact Sheet for Healthcare Providers: IncredibleEmployment.be  This test is not yet approved or cleared by the [place] FDA and has been authorized for detection and/or diagnosis of SARS-CoV-2 by FDA under an Emergency Use Authorization (EUA). This EUA will remain in effect (meaning this test can be used) for the duration of the COVID-19 declaration under Section 564(b)(1) of the Act, 21 U.S.C. section 360bbb-3(b)(1), unless the authorization is terminated or revoked.  Performed at [facility], [place] [address] [place], [place] [address]   Culture, blood (routine x 2)     Status: None (Preliminary result)   Collection Time: 06/24/20 10:31 AM   Specimen: BLOOD  Result Value Ref Range Status   Specimen Description   Final    BLOOD LEFT ANTECUBITAL Performed at [place] [address], [place], [place] [address]    Special Requests   Final    BOTTLES DRAWN AEROBIC AND ANAEROBIC Blood Culture adequate volume Performed at [place] [address], [place], [place] [address]    Culture   Final    NO GROWTH 2 DAYS Performed at [facility] [address], [place], [place] [address]    Report Status PENDING  Incomplete     Time coordinating discharge: 35 minutes  SIGNED: Antonieta Pert, MD  [facility] 06/26/2020, 10:57 AM  If 7PM-7AM, please contact night-coverage [email or web address]

## 2020-06-26 NOTE — Telephone Encounter (Signed)
That is fine. Thanks 

## 2020-06-29 LAB — CULTURE, BLOOD (ROUTINE X 2)
Culture: NO GROWTH
Special Requests: ADEQUATE

## 2020-06-29 NOTE — Telephone Encounter (Signed)
Called back left message with consent to be attending provider

## 2020-07-14 ENCOUNTER — Telehealth: Payer: Self-pay | Admitting: Family Medicine

## 2020-07-14 NOTE — Telephone Encounter (Signed)
Home health order placed in Dr. Vonna Kotyk bin up front

## 2020-07-15 NOTE — Telephone Encounter (Signed)
PA

## 2020-08-05 ENCOUNTER — Telehealth: Payer: Self-pay | Admitting: Family Medicine

## 2020-08-05 NOTE — Telephone Encounter (Signed)
..  Home Health Certification or Plan of Care Tracking  Is this a Certification or Plan of [place]  Order Number:  NA Has charge sheet been attached? Yes Where has form been placed:  In Dr. Vonna Kotyk bin up front

## 2020-08-07 NOTE — Telephone Encounter (Signed)
I have faxed to the # provided on the forms and sent to scan

## 2020-08-12 ENCOUNTER — Telehealth: Payer: Self-pay

## 2020-08-12 NOTE — Telephone Encounter (Signed)
Tia Alert called in stating that patient passed yesterday morning, and is asking that Dr.Greene sign the death certificate, has been uploaded to the [facility] system. Case number is [id number].

## 2020-08-13 ENCOUNTER — Telehealth: Payer: Self-pay | Admitting: Family Medicine

## 2020-08-13 NOTE — Telephone Encounter (Signed)
Certificate completed in [facility] system. Please advise funeral home.

## 2020-08-13 NOTE — Telephone Encounter (Signed)
Notification received from [employer] regarding hospice death. Date of death Aug 11, 2020 at 12:09pm. 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  DAVE site completed for death certificate.  I called son [name] to express condolences on Sheri Miller's passing - left him a message. Will try to reach again.

## 2020-08-14 NOTE — Telephone Encounter (Signed)
Funeral home advised of completion, sympathy card was also mailed today.

## 2020-09-09 DEATH — deceased

## 2020-10-19 ENCOUNTER — Ambulatory Visit: Payer: Self-pay | Admitting: Family Medicine

## 2020-12-28 ENCOUNTER — Ambulatory Visit: Payer: Medicare HMO | Admitting: Neurology

## 2021-06-07 IMAGING — CR DG LUMBAR SPINE 2-3V
3 series · 3 of 3 positions shown · non-contrast
Comparison: None.

CLINICAL DATA: Low back pain with abnormal gait

EXAM:
LUMBAR SPINE - 2-3 VIEW

[t lumbar spine ap]
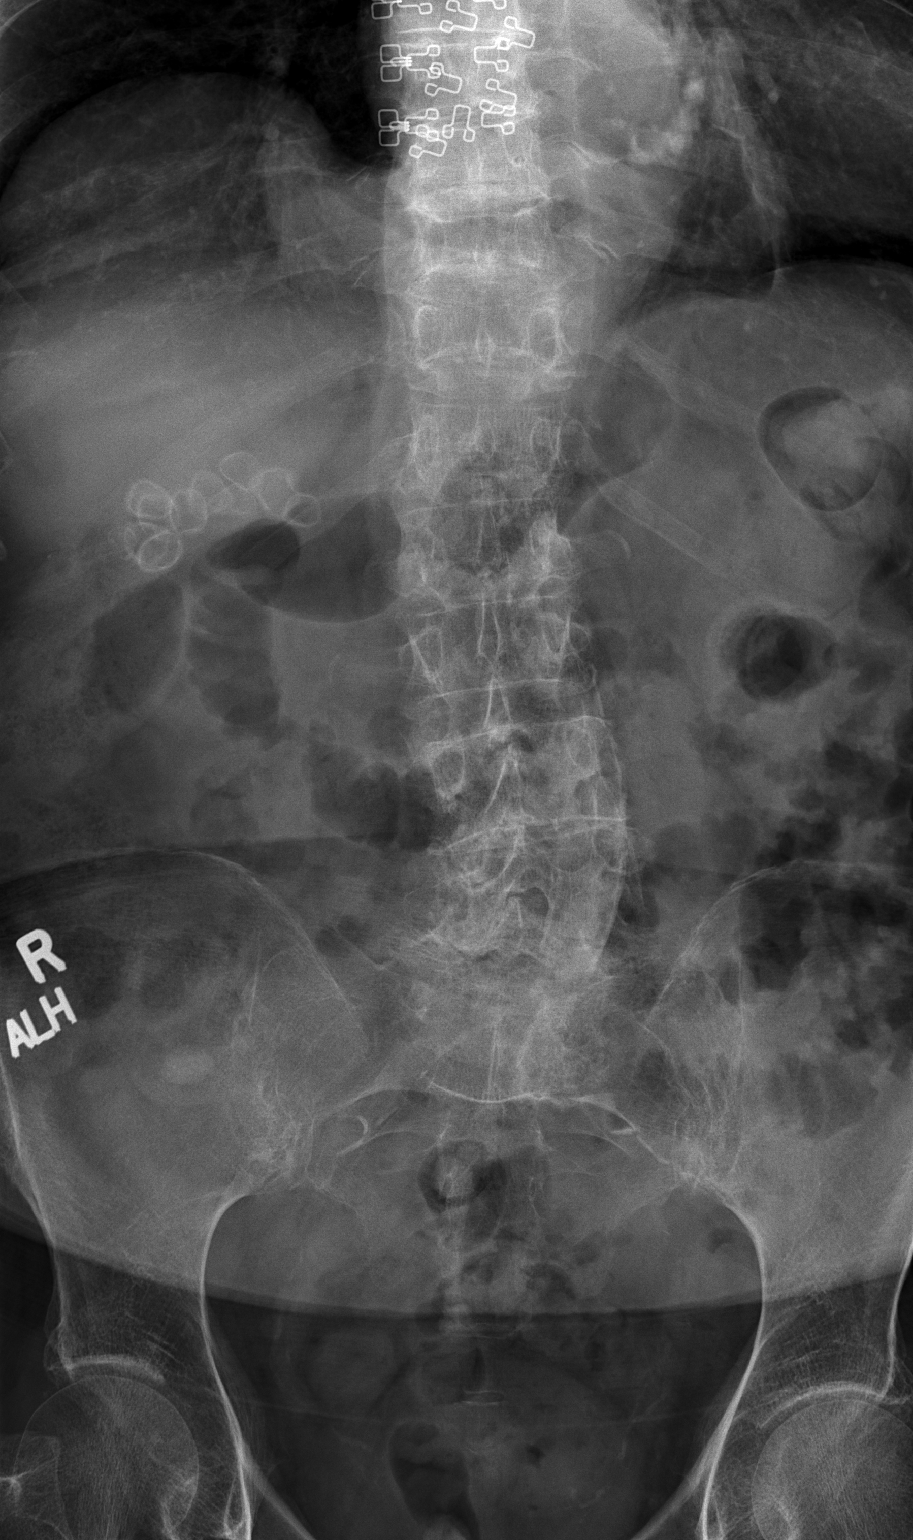

[t lumbar spine lat]
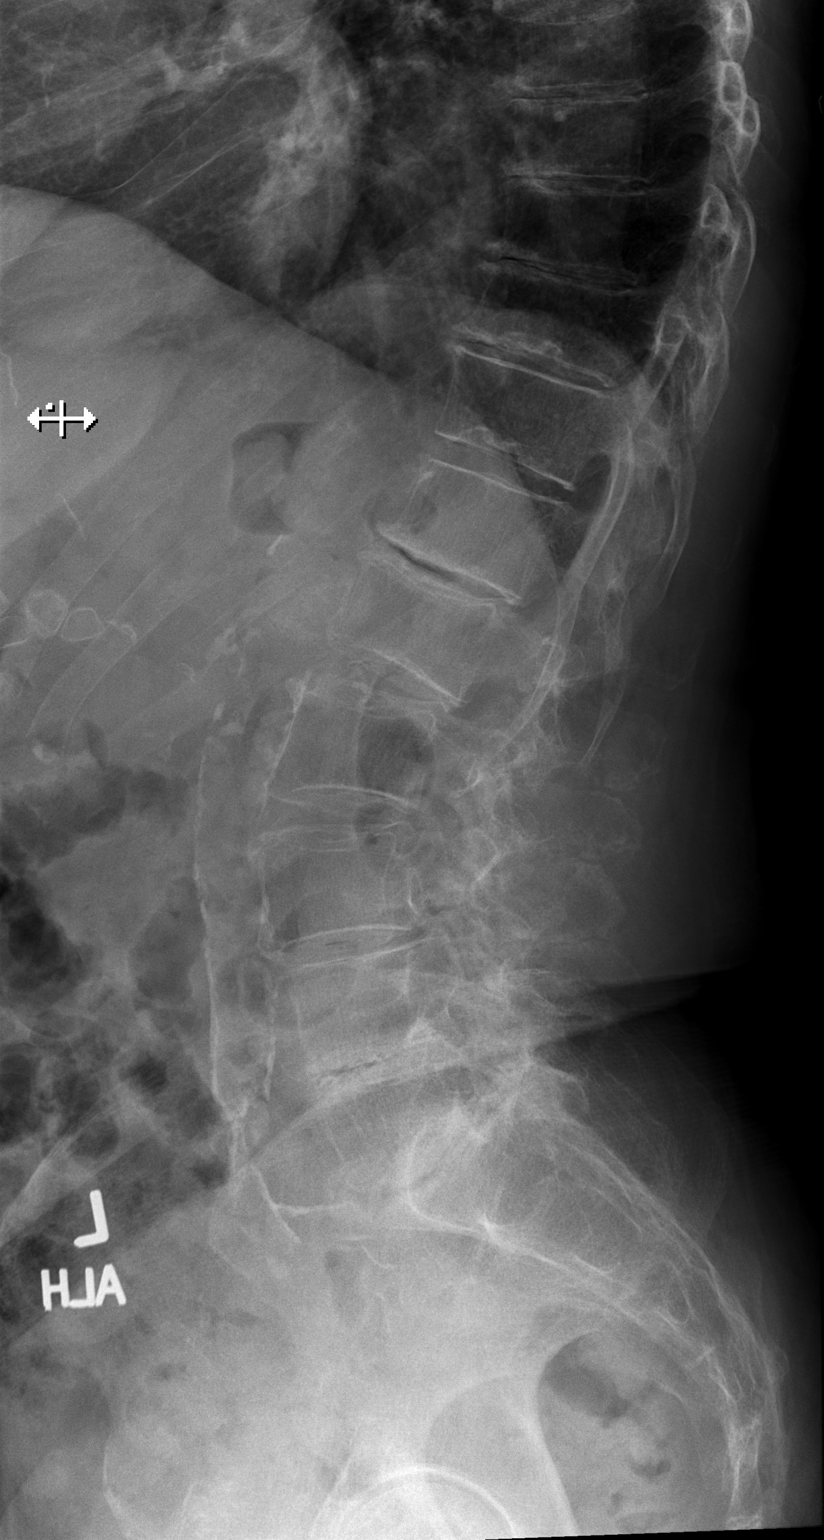

[t lumbar l-5 s-1 spot]
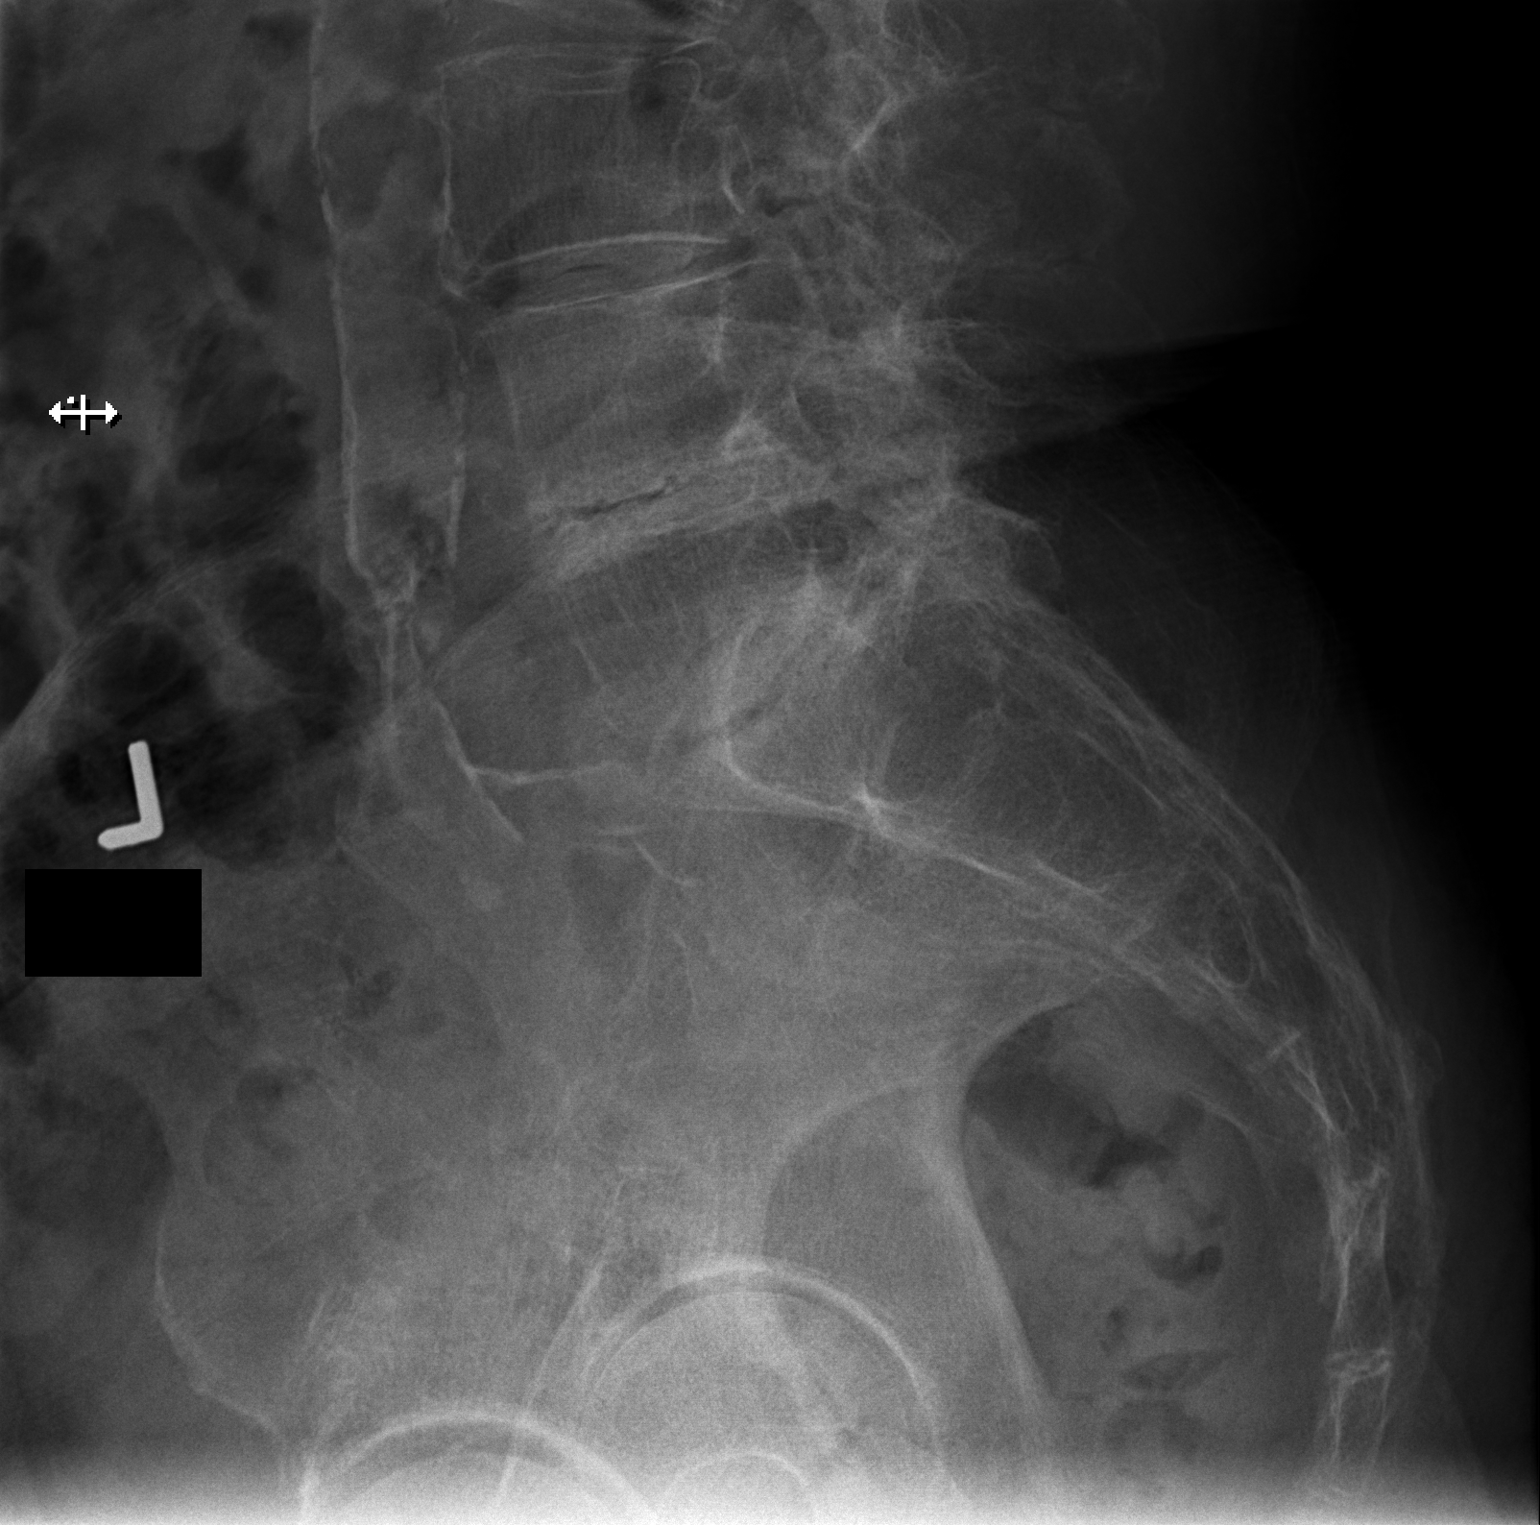

[3 of 3 positions shown; findings below may reference images not displayed]

FINDINGS: 5 nonrib bearing lumbar-type vertebral bodies.

Mild chronic L1 vertebral body height loss. No acute fracture.
Generalized osteopenia. S-shaped curvature of the thoracolumbar
spine.

Grade 1 anterolisthesis of L3 on L4. No spondylolysis.

Degenerative disease with disc height loss throughout the
thoracolumbar spine with bilateral facet arthropathy.

Mild osteoarthritis of bilateral SI joints.

Peripheral vascular atherosclerotic disease.
IMPRESSION: Lumbar spine spondylosis as described above.
